# Patient Record
Sex: Female | Born: 2000 | Race: Black or African American | Hispanic: No | Marital: Single | Smoking: Never smoker
Health system: Southern US, Community
[De-identification: ages and names within clinical notes are randomized; demographics above are authoritative.]

## PROBLEM LIST (undated history)

## (undated) DIAGNOSIS — N926 Irregular menstruation, unspecified: Secondary | ICD-10-CM

## (undated) DIAGNOSIS — E119 Type 2 diabetes mellitus without complications: Secondary | ICD-10-CM

## (undated) DIAGNOSIS — T7840XA Allergy, unspecified, initial encounter: Secondary | ICD-10-CM

## (undated) DIAGNOSIS — I10 Essential (primary) hypertension: Secondary | ICD-10-CM

## (undated) DIAGNOSIS — J45909 Unspecified asthma, uncomplicated: Secondary | ICD-10-CM

## (undated) DIAGNOSIS — E669 Obesity, unspecified: Secondary | ICD-10-CM

## (undated) DIAGNOSIS — K8020 Calculus of gallbladder without cholecystitis without obstruction: Secondary | ICD-10-CM

## (undated) HISTORY — PX: TONSILLECTOMY: SUR1361

## (undated) HISTORY — DX: Type 2 diabetes mellitus without complications: E11.9

## (undated) HISTORY — DX: Irregular menstruation, unspecified: N92.6

## (undated) HISTORY — DX: Unspecified asthma, uncomplicated: J45.909

## (undated) HISTORY — DX: Obesity, unspecified: E66.9

## (undated) HISTORY — DX: Calculus of gallbladder without cholecystitis without obstruction: K80.20

## (undated) HISTORY — DX: Allergy, unspecified, initial encounter: T78.40XA

## (undated) HISTORY — DX: Essential (primary) hypertension: I10

## (undated) HISTORY — PX: WISDOM TOOTH EXTRACTION: SHX21

---
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
## 2021-01-01 ENCOUNTER — Other Ambulatory Visit: Payer: Self-pay | Admitting: Family

## 2021-01-10 ENCOUNTER — Other Ambulatory Visit: Payer: Self-pay | Admitting: Family

## 2021-01-28 ENCOUNTER — Other Ambulatory Visit: Payer: Self-pay

## 2021-01-28 ENCOUNTER — Ambulatory Visit (INDEPENDENT_AMBULATORY_CARE_PROVIDER_SITE_OTHER): Payer: 59 | Admitting: Pediatrics

## 2021-01-28 VITALS — Wt 222.2 lb

## 2021-01-28 DIAGNOSIS — R109 Unspecified abdominal pain: Secondary | ICD-10-CM | POA: Diagnosis not present

## 2021-01-28 DIAGNOSIS — K8000 Calculus of gallbladder with acute cholecystitis without obstruction: Secondary | ICD-10-CM | POA: Diagnosis not present

## 2021-01-28 DIAGNOSIS — K760 Fatty (change of) liver, not elsewhere classified: Secondary | ICD-10-CM | POA: Diagnosis not present

## 2021-01-28 DIAGNOSIS — Z8379 Family history of other diseases of the digestive system: Secondary | ICD-10-CM

## 2021-01-28 LAB — POCT URINALYSIS DIPSTICK
Bilirubin, UA: NEGATIVE
Blood, UA: POSITIVE
Glucose, UA: NEGATIVE
Ketones, UA: NEGATIVE
Leukocytes, UA: NEGATIVE
Nitrite, UA: NEGATIVE
Protein, UA: NEGATIVE
Spec Grav, UA: 1.01 (ref 1.010–1.025)
Urobilinogen, UA: 0.2 E.U./dL
pH, UA: 7.5 (ref 5.0–8.0)

## 2021-01-28 LAB — POCT URINE PREGNANCY: Preg Test, Ur: NEGATIVE

## 2021-01-28 NOTE — Progress Notes (Signed)
CC: abdominal pain   HPI: Krista Wang is here today because of intermittent abdominal pain on the right side. There is strong family history maternally including her mom, grandmother and aunts who have had their gallbladders removed. No vomiting, no pain in her shoulder, no back pain, no fever. Her last period was a month ago and happens regularly.   PE No distress, cooperative  Heart sound intensity, no murmur, RRR Lungs clear  Abdomen obese, non tender, negative murphy sign, non distended  No focal findings  Negative pregnancy test  U/A negative    20 yo with left lower abdominal pain concern for gallbladder given family history  Labs today  Ultrasound ordered  If positive then will refer her to a Gi specialist  Decrease fatty foods and increase fluids  Follow up as needed or if symptoms worsen.

## 2021-01-29 LAB — URINE CULTURE
MICRO NUMBER:: 11644085
Result:: NO GROWTH
SPECIMEN QUALITY:: ADEQUATE

## 2021-01-31 ENCOUNTER — Ambulatory Visit (HOSPITAL_COMMUNITY): Payer: 59

## 2021-02-04 ENCOUNTER — Ambulatory Visit (HOSPITAL_COMMUNITY)
Admission: RE | Admit: 2021-02-04 | Discharge: 2021-02-04 | Disposition: A | Discharge status: Home / Self Care | Payer: 59 | Source: Ambulatory Visit | Attending: Pediatrics | Admitting: Pediatrics

## 2021-02-04 DIAGNOSIS — Z8379 Family history of other diseases of the digestive system: Secondary | ICD-10-CM | POA: Diagnosis present

## 2021-02-04 DIAGNOSIS — R109 Unspecified abdominal pain: Secondary | ICD-10-CM | POA: Insufficient documentation

## 2021-02-07 ENCOUNTER — Encounter: Payer: Self-pay | Admitting: Pediatrics

## 2021-02-07 ENCOUNTER — Telehealth: Payer: Self-pay | Admitting: *Deleted

## 2021-02-07 DIAGNOSIS — K760 Fatty (change of) liver, not elsewhere classified: Secondary | ICD-10-CM | POA: Insufficient documentation

## 2021-02-07 DIAGNOSIS — K8020 Calculus of gallbladder without cholecystitis without obstruction: Secondary | ICD-10-CM | POA: Insufficient documentation

## 2021-02-07 NOTE — Addendum Note (Signed)
Addended by: Shirlean Kelly T on: 02/07/2021 06:50 PM   Modules accepted: Orders

## 2021-02-07 NOTE — Telephone Encounter (Signed)
[place] mom called for results on abdominal xray and Samhita has gall stones. Dr. Laural Benes needs a referral for a GI doctor. Can you do this? Thank you. Almira Coaster

## 2021-02-08 ENCOUNTER — Telehealth: Payer: Self-pay | Admitting: *Deleted

## 2021-02-08 NOTE — Telephone Encounter (Signed)
Referral sent over this morning

## 2021-02-08 NOTE — Telephone Encounter (Signed)
Referral sent over this a.m. will call to follow up about appt

## 2021-02-13 ENCOUNTER — Encounter: Payer: Self-pay | Admitting: Internal Medicine

## 2021-03-07 ENCOUNTER — Encounter (INDEPENDENT_AMBULATORY_CARE_PROVIDER_SITE_OTHER): Payer: Self-pay | Admitting: Family

## 2021-03-07 ENCOUNTER — Ambulatory Visit (INDEPENDENT_AMBULATORY_CARE_PROVIDER_SITE_OTHER): Payer: 59 | Admitting: Family

## 2021-03-07 ENCOUNTER — Other Ambulatory Visit: Payer: Self-pay

## 2021-03-07 VITALS — BP 118/72 | HR 88 | Wt 217.0 lb

## 2021-03-07 DIAGNOSIS — E669 Obesity, unspecified: Secondary | ICD-10-CM

## 2021-03-07 DIAGNOSIS — I10 Essential (primary) hypertension: Secondary | ICD-10-CM

## 2021-03-07 DIAGNOSIS — R739 Hyperglycemia, unspecified: Secondary | ICD-10-CM

## 2021-03-07 DIAGNOSIS — L83 Acanthosis nigricans: Secondary | ICD-10-CM | POA: Diagnosis not present

## 2021-03-07 DIAGNOSIS — Z68.41 Body mass index (BMI) pediatric, greater than or equal to 95th percentile for age: Secondary | ICD-10-CM

## 2021-03-07 DIAGNOSIS — Z794 Long term (current) use of insulin: Secondary | ICD-10-CM | POA: Diagnosis not present

## 2021-03-07 DIAGNOSIS — E1065 Type 1 diabetes mellitus with hyperglycemia: Secondary | ICD-10-CM | POA: Diagnosis not present

## 2021-03-07 LAB — POCT GLYCOSYLATED HEMOGLOBIN (HGB A1C): Hemoglobin A1C: 6.6 % — AB (ref 4.0–5.6)

## 2021-03-07 LAB — POCT GLUCOSE (DEVICE FOR HOME USE): Glucose Fasting, POC: 164 mg/dL — AB (ref 70–99)

## 2021-03-07 NOTE — Progress Notes (Signed)
Pediatric Endocrinology Diabetes Consultation Follow-up Visit  Krista Wang Aug 31, 2001 [id number]  Chief Complaint: Follow-up type 1 diabetes   Krista Oz, MD   HPI: Krista Wang  is a 20 y.o. female presenting for follow-up of type 1 diabetes. she is accompanied to this visit by her mother and father.  1. Krista Wang about two years ago that her urine was thick like juice. She saw her PCP and had elevated urine glucose. She went to [facility] where her HbA1c was 11%. She was diagnosed with T2DM, started on metformin, 500 mg, twice daily, and followed at [facility] until about 9 months ago. During that time her weight had continued to increase to apeak weight of 223 pounds in march 2017. Her HbA1c had decreased to about 6% as of her last visit. Her weight has decreased from 217 pounds in May 2017 to 189 now. She has been trying to lose weight.  About two weeks ago Krista Wang became aware that she was urinating more, had nocturia, and was drinking more. She also developed nausea, upset stomach severe enough to cause her not to want to eat, and abdominal pains. Her vision has been normal. She has lost 31 pounds since May. She presented to clinic with a A1c of >14 and urine ketones. She was admitted to the [facility] and started on two component method using Lantus and Novolog.   She had elevated insulin antibody with normal GAD and negative pancreatic islet cell antibody.   2. Since discharge from hospital on 11/2020, Krista Wang has been doing well. No Hospitalizations.   She is doing well at [employer], major in psychology. She is living in dorm but it is going well. She is working at [employer]. She gets exercise by walking for class and is walking 1 hour per day with her friends.   Wearing Dexcom CGM, it is working well. Occasionally has false lows at night if she sleeps on dexcom. She is giving injection before eating. Estimates her  carb counts but feels like she is pretty accurate. Hypoglycemia is rare, she is not feeling lows consistently.  Concerns:  - Running high around 3pm.   Followed by nephrology for hypertension. She has not returned for follow up appointment.   Insulin regimen: 32 units of Lantus. Novolog 120/30/6 plan  Hypoglycemia: Able to feel low blood sugars.  No glucagon needed recently.  Blood glucose download:  Dexcom CGM:      Med-alert ID: Not currently wearing. Injection sites: Arms, legs and abdomen  Annual labs due: 05/2021 Ophthalmology due: 2021. Discussed with family today.     3. ROS: Greater than 10 systems reviewed with pertinent positives listed in HPI, otherwise neg. Review of Systems  Constitutional: Negative for malaise/fatigue.  HENT: Negative.   Eyes: Negative for blurred vision and pain.  Respiratory: Negative for cough and shortness of breath.   Cardiovascular: Negative for chest pain and palpitations.  Gastrointestinal: Negative for abdominal pain, constipation, diarrhea and heartburn.  Genitourinary: Negative for frequency and urgency.  Musculoskeletal: Negative for neck pain.  Skin: Negative for itching and rash.  Neurological: Negative for dizziness, tremors, sensory change and weakness.  Endo/Heme/Allergies: Negative for polydipsia.  Psychiatric/Behavioral: Negative for depression. The patient is not nervous/anxious.      Past Medical History:   Past Medical History:  Diagnosis Date  . Allergy   . Asthma   . Diabetes mellitus without complication (HCC)   . Gallstones   . Hypertension    [facility], did not follow up or have an appt in July 2019 per Nephrology's recs  . Impacted ear wax 12/21/2015  . Irregular menses   . Nocturnal enuresis 07/01/2013  . Obesity   . Unspecified constipation 07/07/2013  . Weight gain 07/07/2013    Medications:  Outpatient Encounter Medications as of 03/07/2021  Medication Sig  . Continuous Blood Gluc  Sensor (DEXCOM G6 SENSOR) MISC USE AS DIRECTED CHANGE  SENSOR  EVERY  10  DAYS  . Continuous Blood Gluc Transmit (DEXCOM G6 TRANSMITTER) MISC USE AS DIRECTED EVERY 3 MONTHS  . insulin glargine (LANTUS SOLOSTAR) 100 UNIT/ML Solostar Pen INJECT UP TO 50 UNITS SUBCUTANEOUSLY ONCE DAILY  . norgestimate-ethinyl estradiol (SPRINTEC 28) 0.25-35 MG-MCG tablet Take 1 tablet by mouth daily. Patient overdue for yearly check up, needs to schedule  . NOVOLOG FLEXPEN 100 UNIT/ML FlexPen INJECT UP TO 50 UNITS SUBCUTANEOUSLY ONCE DAILY AS DIRECTED  . Accu-Chek FastClix Lancets MISC USE 1 LANCET TO CHECK GLUCOSE SIX TIMES DAILY (Patient not taking: No sig reported)  . ACCU-CHEK GUIDE test strip USE TO CHECK GLUCOSE 6 TIMES DAILY (Patient not taking: No sig reported)  . acetone, urine, test strip Check ketones per protocol (Patient not taking: No sig reported)  . albuterol (PROVENTIL HFA;VENTOLIN HFA) 108 (90 Base) MCG/ACT inhaler 2 puffs before exercise or every 4 to 6 hours as needed for cough or wheezing (Patient not taking: No sig reported)  . BAQSIMI ONE PACK 3 MG/DOSE POWD PLACE 1 DOSE INTO THE NOSE AS NEEDED (Patient not taking: No sig reported)  . BD PEN NEEDLE NANO 2ND GEN 32G X 4 MM MISC USE 1 PEN NEEDLE SIX TIMES DAILY TO INJECT INSULIN (Patient not taking: No sig reported)  . glucagon 1 MG injection Use for Severe Hypoglycemia . Inject 1 mg intramuscularly if unresponsive, unable to swallow, unconscious and/or has seizure (Patient not taking: No sig reported)   No facility-administered encounter medications on file as of 03/07/2021.    Allergies: Allergies  Allergen Reactions  . Blue Dyes (Parenteral) Swelling    Surgical History: Past Surgical History:  Procedure Laterality Date  . TONSILLECTOMY    . WISDOM TOOTH EXTRACTION      Family History:  Family History  Problem Relation Age of Onset  . Diabetes Mother   . Hypertension Mother   . Diabetes Father   . Hypertension Father   .  Hypertension Maternal Grandmother   . Stroke Maternal Grandmother   . Diabetes Maternal Grandmother   . Thyroid disease Maternal Grandmother   . Heart attack Maternal Grandfather   . Diabetes Paternal Grandmother   . Hypertension Paternal Grandmother   . Dementia Paternal Grandmother   . Kidney failure Paternal Grandfather   . Hypertension Paternal Grandfather   . Diabetes Paternal Grandfather   . Thyroid disease Maternal Aunt       Social History: Lives with: mother and father  [employer]   Physical Exam:  Vitals:   03/07/21 0947  BP: 118/72  Pulse: 88  Weight: 217 lb (98.4 kg)   BP 118/72 (BP Location: Left Arm, Patient Position: Sitting, Cuff Size: Normal)   Pulse 88   Wt 217 lb (98.4 kg)   BMI 34.87 kg/m  Body mass index: body mass index is 34.87 kg/m. Blood pressure percentiles are not available for patients who are 18 years or older.  Ht Readings from Last 3 Encounters:  05/23/20 5' 6.14" (1.68 m) (77 %, Z= 0.74)*  04/10/20 5' 4.57" (1.64 m) (55 %, Z= 0.13)*  10/05/19 5' 5.35" (1.66 m) (67 %, Z= 0.45)*   * Growth percentiles are based on CDC (Girls, 2-20 Years) data.   Wt Readings from Last 3 Encounters:  03/07/21 217 lb (98.4 kg) (99 %, Z= 2.17)*  01/28/21 222 lb 3.2 Wang (100.8 kg) (99 %, Z= 2.23)*  08/28/20 215 lb 6.4 Wang (97.7 kg) (98 %, Z= 2.15)*   * Growth percentiles are based on CDC (Girls, 2-20 Years) data.    Physical Exam  General: Well developed, well nourished female in no acute distress.   Head: Normocephalic, atraumatic.   Eyes:  Pupils equal and round. EOMI.   Sclera white.  No eye drainage.   Ears/Nose/Mouth/Throat: Nares patent, no nasal drainage.  Normal dentition, mucous membranes moist.   Neck: supple, no cervical lymphadenopathy, no thyromegaly Cardiovascular: regular rate, normal S1/S2, no murmurs Respiratory: No increased work of breathing.  Lungs clear to auscultation bilaterally.  No wheezes. Abdomen: soft, nontender,  nondistended. Normal bowel sounds.  No appreciable masses  Extremities: warm, well perfused, cap refill < 2 sec.   Musculoskeletal: Normal muscle mass.  Normal strength Skin: warm, dry.  No rash or lesions. + acanthosis nigricans to posterior neck.  Neurologic: alert and oriented, normal speech, no tremor   Labs:  Last Hemoglobin A1c: 6.8% on 08/2020  Results for orders placed or performed in visit on 03/07/21  POCT glycosylated hemoglobin (Hb A1C)  Result Value Ref Range   Hemoglobin A1C 6.6 (A) 4.0 - 5.6 %   HbA1c POC (<> result, manual entry)     HbA1c, POC (prediabetic range)     HbA1c, POC (controlled diabetic range)    POCT Glucose (Device for Home Use)  Result Value Ref Range   Glucose Fasting, POC 164 (A) 70 - 99 mg/dL   POC Glucose      Assessment/Plan: Krista Wang is a 20 y.o. female with type 1 diabetes on MDI and Dexcom CGM. Doing well with diabetes management. Has a pattern of mild hyperglycemia overnight, will increase Lantus. Hemoglobin A1c of 6.6% meets the ADA goal of <7%.    1. DM w/o complication type I, uncontrolled (HCC)/hypergylcemia/elevated a1c  - Increase to 34 units of Lantus   -  Novolog 120/30/6 plan.   - Reviewed meter and CGM download. Discussed trends and patterns.  - Rotate injection sites to prevent scar tissue.  - bolus 15 minutes prior to eating to limit blood sugar spikes.  - Reviewed carb counting and importance of accurate carb counting.  - Discussed signs and symptoms of hypoglycemia. Always have glucose available.  - POCT glucose and hemoglobin A1c  - Reviewed growth chart.   - Discussed insulin pump technology. Declines at this time.   2. Acanthosis nigricans - Stable and consistent with insulin resistance.   3. Obesity - Reviewed growth chart  - Encouraged daily activity  - Discussed diet.   4. Hypertension.  - Continue follow up with Nephrology  - Discussed importance of good diabetes care.     Follow-up:   3 month. Mychart  message with blood sugars as needed for adjustments.      >50 spent today reviewing the medical chart, counseling the patient/family, and documenting today's visit.   When a patient is on insulin, intensive monitoring of blood glucose levels is necessary to avoid hyperglycemia and hypoglycemia. Severe hyperglycemia/hypoglycemia can lead to hospital admissions and be life threatening.    Gretchen Short,  FNP-C  Pediatric  Specialist  [address]  [place], [address]  Tele: [phone]

## 2021-03-07 NOTE — Patient Instructions (Signed)

## 2021-03-21 ENCOUNTER — Ambulatory Visit: Payer: 59 | Admitting: Internal Medicine

## 2021-03-21 ENCOUNTER — Encounter: Payer: Self-pay | Admitting: Internal Medicine

## 2021-03-21 ENCOUNTER — Other Ambulatory Visit: Payer: Self-pay | Admitting: *Deleted

## 2021-03-21 ENCOUNTER — Other Ambulatory Visit: Payer: Self-pay

## 2021-03-21 VITALS — BP 130/85 | HR 92 | Temp 97.3°F | Ht 66.0 in | Wt 222.0 lb

## 2021-03-21 DIAGNOSIS — K8000 Calculus of gallbladder with acute cholecystitis without obstruction: Secondary | ICD-10-CM | POA: Diagnosis not present

## 2021-03-21 DIAGNOSIS — K5900 Constipation, unspecified: Secondary | ICD-10-CM

## 2021-03-21 DIAGNOSIS — K760 Fatty (change of) liver, not elsewhere classified: Secondary | ICD-10-CM

## 2021-03-21 NOTE — Patient Instructions (Addendum)
We will refer you to Dr. Henreitta Leber to discuss cholecystectomy.  For your fatty liver, it is very important to keep your diabetes under good control.  Can also work on weight loss and living a healthy lifestyle.  Avoid alcohol in excess.  I will check some baseline liver labs today in clinic.  We will call you with these results.  For your constipation I want you to start taking MiraLAX 1 capful daily.  If this is not adequate you can go up to 2 capfuls daily.  At [facility] we value your feedback. You may receive a survey about your visit today. Please share your experience as we strive to create trusting relationships with our patients to provide genuine, compassionate, quality care.  We appreciate your understanding and patience as we review any laboratory studies, imaging, and other diagnostic tests that are ordered as we care for you. Our office policy is 5 business days for review of these results, and any emergent or urgent results are addressed in a timely manner for your best interest. If you do not hear from our office in 1 week, please contact us.   We also encourage the use of MyChart, which contains your medical information for your review as well. If you are not enrolled in this feature, an access code is on this after visit summary for your convenience. Thank you for allowing [place] to be involved in your care.  It was great to see you today!  I hope you have a great rest of your spring!!    Hennie Duos. Marletta Lor, D.O. Gastroenterology and Hepatology [facility]

## 2021-03-21 NOTE — Progress Notes (Signed)
Primary Care Physician:  Fransisca Connors, MD Primary Gastroenterologist:  Dr. Abbey Chatters  Chief Complaint  Patient presents with  . fatty liver  . Abdominal Pain    Right upper abd  . Nausea    No vomiting  . Constipation    HPI:   Krista Wang is a 20 y.o. female who presents to the clinic today by referral from her PCP Dr. Raul Del for evaluation.  She has a history of type 1 diabetes as well as obesity.  She states approximately 8 months ago she began having intermittent right upper quadrant pain.  She would have these "attacks" every so often but seems as though they have been more frequent as of late.  Her most recent episode she had sudden onset right upper quadrant pain that radiated to her shoulder.  Pain was so severe she could not sleep.  She underwent ultrasound which showed cholelithiasis and fatty liver disease.  Also has some associated nausea with these attacks.  No diarrhea.  Does note constipation which she has been dealing with her whole life.  She has taken fiber Gummies which made her go "too much."  No melena hematochezia.  No lower abdominal pain.  Does note some bloating when she gets "backed up."  Past Medical History:  Diagnosis Date  . Allergy   . Asthma   . Diabetes mellitus without complication ([place])   . Gallstones   . Hypertension    Evaluated by [facility] in 2019, did not follow up or have an appt in July 2019 per Nephrology's recs  . Impacted ear wax 12/21/2015  . Irregular menses   . Nocturnal enuresis 07/01/2013  . Obesity   . Unspecified constipation 07/07/2013  . Weight gain 07/07/2013    Past Surgical History:  Procedure Laterality Date  . TONSILLECTOMY    . WISDOM TOOTH EXTRACTION      Current Outpatient Medications  Medication Sig Dispense Refill  . Accu-Chek FastClix Lancets MISC USE 1 LANCET TO CHECK GLUCOSE SIX TIMES DAILY 204 each 5  . ACCU-CHEK GUIDE test strip USE TO CHECK GLUCOSE 6 TIMES DAILY 200 each 5  . albuterol  (PROVENTIL HFA;VENTOLIN HFA) 108 (90 Base) MCG/ACT inhaler 2 puffs before exercise or every 4 to 6 hours as needed for cough or wheezing 2 Inhaler 1  . BD PEN NEEDLE NANO 2ND GEN 32G X 4 MM MISC USE 1 PEN NEEDLE SIX TIMES DAILY TO INJECT INSULIN 200 each 5  . Continuous Blood Gluc Sensor (DEXCOM G6 SENSOR) MISC USE AS DIRECTED CHANGE  SENSOR  EVERY  10  DAYS 3 each 5  . Continuous Blood Gluc Transmit (DEXCOM G6 TRANSMITTER) MISC USE AS DIRECTED EVERY 3 MONTHS 2 each 5  . glucagon 1 MG injection Use for Severe Hypoglycemia . Inject 1 mg intramuscularly if unresponsive, unable to swallow, unconscious and/or has seizure 2 kit 3  . insulin glargine (LANTUS SOLOSTAR) 100 UNIT/ML Solostar Pen INJECT UP TO 50 UNITS SUBCUTANEOUSLY ONCE DAILY 15 mL 5  . norgestimate-ethinyl estradiol (SPRINTEC 28) 0.25-35 MG-MCG tablet Take 1 tablet by mouth daily. Patient overdue for yearly check up, needs to schedule 30 tablet 11  . NOVOLOG FLEXPEN 100 UNIT/ML FlexPen INJECT UP TO 50 UNITS SUBCUTANEOUSLY ONCE DAILY AS DIRECTED 15 mL 5  . acetone, urine, test strip Check ketones per protocol (Patient not taking: No sig reported) 50 each 3  . BAQSIMI ONE PACK 3 MG/DOSE POWD PLACE 1 DOSE INTO THE NOSE AS NEEDED (Patient  not taking: No sig reported) 2 each 5   No current facility-administered medications for this visit.    Allergies as of 03/21/2021 - Review Complete 03/21/2021  Allergen Reaction Noted  . Blue dyes (parenteral) Swelling 08/18/2017    Family History  Problem Relation Age of Onset  . Diabetes Mother   . Hypertension Mother   . Diabetes Father   . Hypertension Father   . Hypertension Maternal Grandmother   . Stroke Maternal Grandmother   . Diabetes Maternal Grandmother   . Thyroid disease Maternal Grandmother   . Heart attack Maternal Grandfather   . Diabetes Paternal Grandmother   . Hypertension Paternal Grandmother   . Dementia Paternal Grandmother   . Kidney failure Paternal Grandfather   .  Hypertension Paternal Grandfather   . Diabetes Paternal Grandfather   . Thyroid disease Maternal Aunt     Social History   Socioeconomic History  . Marital status: Single    Spouse name: Not on file  . Number of children: Not on file  . Years of education: Not on file  . Highest education level: Not on file  Occupational History  . Not on file  Tobacco Use  . Smoking status: Never Smoker  . Smokeless tobacco: Never Used  Substance and Sexual Activity  . Alcohol use: No  . Drug use: No  . Sexual activity: Never  Other Topics Concern  . Not on file  Social History Narrative   Lives with parents and sibling         Will attend [employer] in fall 2021, thinking about being a Marine scientist    Social Determinants of Radio broadcast assistant Strain: Not on file  Food Insecurity: Not on file  Transportation Needs: Not on file  Physical Activity: Not on file  Stress: Not on file  Social Connections: Not on file  Intimate Partner Violence: Not on file    Subjective: Review of Systems  Constitutional: Negative for chills and fever.  HENT: Negative for congestion and hearing loss.   Eyes: Negative for blurred vision and double vision.  Respiratory: Negative for cough and shortness of breath.   Cardiovascular: Negative for chest pain and palpitations.  Gastrointestinal: Positive for abdominal pain and nausea. Negative for blood in stool, constipation, diarrhea, heartburn, melena and vomiting.  Genitourinary: Negative for dysuria and urgency.  Musculoskeletal: Negative for joint pain and myalgias.  Skin: Negative for itching and rash.  Neurological: Negative for dizziness and headaches.  Psychiatric/Behavioral: Negative for depression. The patient is not nervous/anxious.        Objective: BP 130/85   Pulse 92   Temp (!) 97.3 F (36.3 C) (Temporal)   Ht _0  (1.676 m)   Wt 222 lb (100.7 kg)   LMP 02/21/2021 (Approximate)   BMI 35.83 kg/m  Physical Exam Constitutional:       Appearance: Normal appearance.  HENT:     Head: Normocephalic and atraumatic.  Eyes:     Extraocular Movements: Extraocular movements intact.     Conjunctiva/sclera: Conjunctivae normal.  Cardiovascular:     Rate and Rhythm: Normal rate and regular rhythm.  Pulmonary:     Effort: Pulmonary effort is normal.     Breath sounds: Normal breath sounds.  Abdominal:     General: Bowel sounds are normal.     Palpations: Abdomen is soft.  Musculoskeletal:        General: No swelling. Normal range of motion.     Cervical back: Normal range of  motion and neck supple.  Skin:    General: Skin is warm and dry.     Coloration: Skin is not jaundiced.  Neurological:     General: No focal deficit present.     Mental Status: She is alert and oriented to person, place, and time.  Psychiatric:        Mood and Affect: Mood normal.        Behavior: Behavior normal.      Assessment: *Right upper quadrant abdominal pain *Cholelithiasis *Constipation *Fatty liver disease  Plan: Patient's right upper quadrant pain likely due to cholelithiasis seen on ultrasound.  We will refer to Dr. Constance Haw to further discuss potential cholecystectomy.  For her constipation I recommended that she add daily MiraLAX 1 capful.  If this is not adequate she can go to 1 capful twice daily.  Discussed fatty liver disease in depth with her today.  Risk factors for this particular patient include type 1 diabetes as well as being overweight.  Counseled on the vast importance of keeping her diabetes under control.  Most recent A1c 6.6 also counseled on living a healthy lifestyle diet and exercise wise as well as avoiding alcohol in excess.  Will order baseline liver labs today.  Thank you Dr. Raul Del for the kind referral.  03/21/2021 9:36 AM   Disclaimer: This note was dictated with voice recognition software. Similar sounding words can inadvertently be transcribed and may not be corrected upon review.

## 2021-03-26 ENCOUNTER — Ambulatory Visit (INDEPENDENT_AMBULATORY_CARE_PROVIDER_SITE_OTHER): Payer: 59 | Admitting: General Surgery

## 2021-03-26 ENCOUNTER — Other Ambulatory Visit: Payer: Self-pay

## 2021-03-26 ENCOUNTER — Encounter: Payer: Self-pay | Admitting: General Surgery

## 2021-03-26 VITALS — BP 109/75 | HR 87 | Temp 98.2°F | Resp 14 | Ht 66.0 in | Wt 224.0 lb

## 2021-03-26 DIAGNOSIS — K8020 Calculus of gallbladder without cholecystitis without obstruction: Secondary | ICD-10-CM | POA: Diagnosis not present

## 2021-03-26 NOTE — Patient Instructions (Signed)
Cholelithiasis  Cholelithiasis happens when gallstones form in the gallbladder. The gallbladder stores bile. Bile is a fluid that helps digest fats. Bile can harden and form into gallstones. If they cause a blockage, they can cause pain (gallbladder attack). What are the causes? This condition may be caused by:  Some blood diseases, such as sickle cell anemia.  Too much of a fat-like substance (cholesterol) in your bile.  Not enough bile salts in your bile. These salts help the body absorb and digest fats.  The gallbladder not emptying fully or often enough. This is common in pregnant women. What increases the risk? The following factors may make you more likely to develop this condition:  Being female.  Being pregnant many times.  Eating a lot of fried foods, fat, and refined carbs (refined carbohydrates).  Being very overweight (obese).  Being older than age 40.  Using medicines with female hormones in them for a long time.  Losing weight fast.  Having gallstones in your family.  Having some health problems, such as diabetes, Crohn's disease, or liver disease. What are the signs or symptoms? Often, there may be gallstones but no symptoms. These gallstones are called silent gallstones. If a gallstone causes a blockage, you may get sudden pain. The pain:  Can be in the upper right part of your belly (abdomen).  Normally comes at night or after you eat.  Can last an hour or more.  Can spread to your right shoulder, back, or chest.  Can feel like discomfort, burning, or fullness in the upper part of your belly (indigestion). If the blockage lasts more than a few hours, you can get an infection or swelling. You may:  Feel like you may vomit.  Vomit.  Feel bloated.  Have belly pain for 5 hours or more.  Feel tender in your belly, often in the upper right part and under your ribs.  Have fever or chills.  Have skin or the white parts of your eyes turn yellow  (jaundice).  Have dark pee (urine) or pale poop (stool). How is this treated? Treatment for this condition depends on how bad you feel. If you have symptoms, you may need:  Home care, if symptoms are not very bad. ? Do not eat for 12-24 hours. Drink only water and clear liquids. ? Start to eat simple or clear foods after 1 or 2 days. Try broths and crackers. ? You may need medicines for pain or stomach upset or both. ? If you have an infection, you will need antibiotics.  A hospital stay, if you have very bad pain or a very bad infection.  Surgery to remove your gallbladder. You may need this if: ? Gallstones keep coming back. ? You have very bad symptoms.  Medicines to break up gallstones. Medicines: ? Are best for small gallstones. ? May be used for up to 6-12 months.  A procedure to find and take out gallstones or to break up gallstones. Follow these instructions at home: Medicines  Take over-the-counter and prescription medicines only as told by your doctor.  If you were prescribed an antibiotic medicine, take it as told by your doctor. Do not stop taking the antibiotic even if you start to feel better.  Ask your doctor if the medicine prescribed to you requires you to avoid driving or using machinery. Eating and drinking  Drink enough fluid to keep your urine pale yellow. Drink water or clear fluids. This is important when you have pain.  Eat   healthy foods. Choose: ? Fewer fatty foods, such as fried foods. ? Fewer refined carbs. Avoid breads and grains that are highly processed, such as white bread and white rice. Choose whole grains, such as whole-wheat bread and brown rice. ? More fiber. Almonds, fresh fruit, and beans are healthy sources. General instructions  Keep a healthy weight.  Keep all follow-up visits as told by your doctor. This is important. Where to find more information  National Institute of Diabetes and Digestive and Kidney Diseases:  www.niddk.nih.gov Contact a doctor if:  You have sudden pain in the upper right part of your belly. Pain might spread to your right shoulder, back, or chest.  You have been diagnosed with gallstones that have no symptoms and you get: ? Belly pain. ? Discomfort, burning, or fullness in the upper part of your abdomen.  You have dark urine or pale stools. Get help right away if:  You have sudden pain in the upper right part of your abdomen, and the pain lasts more than 2 hours.  You have pain in your abdomen, and: ? It lasts more than 5 hours. ? It keeps getting worse.  You have a fever or chills.  You keep feeling like you may vomit.  You keep vomiting.  Your skin or the white parts of your eyes turn yellow. Summary  Cholelithiasis happens when gallstones form in the gallbladder.  This condition may be caused by a blood disease, too much of a fat-like substance in the bile, or not enough bile salts in bile.  Treatment for this condition depends on how bad you feel.  If you have symptoms, do not eat or drink. You may need medicines. You may need a hospital stay for very bad pain or a very bad infection.  You may need surgery if gallstones keep coming back or if you have very bad symptoms. This information is not intended to replace advice given to you by your health care provider. Make sure you discuss any questions you have with your health care provider. Document Revised: 12/23/2019 Document Reviewed: 09/26/2019 Elsevier Patient Education  2021 Elsevier Inc.   Minimally Invasive Cholecystectomy Minimally invasive cholecystectomy is surgery to remove the gallbladder. The gallbladder is a pear-shaped organ that lies beneath the liver on the right side of the body. The gallbladder stores bile, which is a fluid that helps the body digest fats. Cholecystectomy is often done to treat inflammation of the gallbladder (cholecystitis). This condition is usually caused by a buildup of  gallstones (cholelithiasis) in the gallbladder. Gallstones can block the flow of bile, which can result in inflammation and pain. In severe cases, emergency surgery may be required. This procedure is done though small incisions in the abdomen, instead of one large incision. It is also called laparoscopic surgery. A thin scope with a camera (laparoscope) is inserted through one incision. Then surgical instruments are inserted through the other incisions. In some cases, a minimally invasive surgery may need to be changed to a surgery that is done through a larger incision. This is called open surgery. Tell a health care provider about:  Any allergies you have.  All medicines you are taking, including vitamins, herbs, eye drops, creams, and over-the-counter medicines.  Any problems you or family members have had with anesthetic medicines.  Any blood disorders you have.  Any surgeries you have had.  Any medical conditions you have.  Whether you are pregnant or may be pregnant. What are the risks? Generally, this is a   safe procedure. However, problems may occur, including:  Infection.  Bleeding.  Allergic reactions to medicines.  Damage to nearby structures or organs.  A stone remaining in the common bile duct. The common bile duct carries bile from the gallbladder into the small intestine.  A bile leak from the cyst duct that is clipped when your gallbladder is removed. Medicines Ask your health care provider about:  Changing or stopping your regular medicines. This is especially important if you are taking diabetes medicines or blood thinners.  Taking medicines such as aspirin and ibuprofen. These medicines can thin your blood. Do not take these medicines unless your health care provider tells you to take them.  Taking over-the-counter medicines, vitamins, herbs, and supplements. General instructions  Let your health care provider know if you develop a cold or an infection  before surgery.  Plan to have someone take you home from the hospital or clinic.  If you will be going home right after the procedure, plan to have someone with you for 24 hours.  Ask your health care provider: ? How your surgery site will be marked. ? What steps will be taken to help prevent infection. These may include:  Removing hair at the surgery site.  Washing skin with a germ-killing soap.  Taking antibiotic medicine. What happens during the procedure?  An IV will be inserted into one of your veins.  You will be given one or both of the following: ? A medicine to help you relax (sedative). ? A medicine to make you fall asleep (general anesthetic).  A breathing tube will be placed in your mouth.  Your surgeon will make several small incisions in your abdomen.  The laparoscope will be inserted through one of the small incisions. The camera on the laparoscope will send images to a monitor in the operating room. This lets your surgeon see inside your abdomen.  A gas will be pumped into your abdomen. This will expand your abdomen to give the surgeon more room to perform the surgery.  Other tools that are needed for the procedure will be inserted through the other incisions. The gallbladder will be removed through one of the incisions.  Your common bile duct may be examined. If stones are found in the common bile duct, they may be removed.  After your gallbladder has been removed, the incisions will be closed with stitches (sutures), staples, or skin glue.  Your incisions may be covered with a bandage (dressing). The procedure may vary among health care providers and hospitals.   What happens after the procedure?  Your blood pressure, heart rate, breathing rate, and blood oxygen level will be monitored until you leave the hospital or clinic.  You will be given medicines as needed to control your pain.  If you were given a sedative during the procedure, it can affect you  for several hours. Do not drive or operate machinery until your health care provider says that it is safe. Summary  Minimally invasive cholecystectomy, also called laparoscopic cholecystectomy, is surgery to remove the gallbladder using small incisions.  Tell your health care provider about all the medical conditions you have and all the medicines you are taking for those conditions.  Before the procedure, follow instructions about eating or drinking restrictions and changing or stopping medicines.  If you were given a sedative during the procedure, it can affect you for several hours. Do not drive or operate machinery until your health care provider says that it is safe. This   information is not intended to replace advice given to you by your health care provider. Make sure you discuss any questions you have with your health care provider. Document Revised: 08/08/2019 Document Reviewed: 08/08/2019 Elsevier Patient Education  2021 Elsevier Inc.   

## 2021-03-26 NOTE — Progress Notes (Signed)
[facility] History and Physical  Reason for Referral: Gallstones  Referring Physician:  Dr. Marletta Lor   Chief Complaint    New Patient (Initial Visit)      Krista Wang is a 20 y.o. female.  HPI:  Krista Wang is a 20 yo with type I DM who has been having issues with RUQ pain radiating to her shoulder and constipation. She was referred to Dr. Marletta Lor by Dr. Meredeth Ide, Randa Evens, MD her PCP.  She had an [place] that demonstrated gallstones and fatty liver.  She started having pain about 8 months ago and the attacks are more frequent now. She has some associated nausea. The pain has been getting more severe in the RUQ and is stabbing in nature.  She has chronic constipation and was recommended to do some Miralax by Dr. Marletta Lor.  She says that her symptoms all started with eating a steak and have continued whenever she eats beef.   She is a type 1 diabetic and does not have a pump. She does have a Dexcom blood glucose reader. She says her blood sugars are normally about 120 in the AM and if she does not eat would be about 105 by lunch.    Past Medical History:  Diagnosis Date  . Allergy   . Asthma   . Diabetes mellitus without complication (HCC)   . Gallstones   . Hypertension    Evaluated by [facility] in 2019, did not follow up or have an appt in July 2019 per Nephrology's recs  . Impacted ear wax 12/21/2015  . Irregular menses   . Nocturnal enuresis 07/01/2013  . Obesity   . Unspecified constipation 07/07/2013  . Weight gain 07/07/2013    Past Surgical History:  Procedure Laterality Date  . TONSILLECTOMY    . WISDOM TOOTH EXTRACTION      Family History  Problem Relation Age of Onset  . Diabetes Mother   . Hypertension Mother   . Diabetes Father   . Hypertension Father   . Hypertension Maternal Grandmother   . Stroke Maternal Grandmother   . Diabetes Maternal Grandmother   . Thyroid disease Maternal Grandmother   . Heart attack Maternal Grandfather   . Diabetes  Paternal Grandmother   . Hypertension Paternal Grandmother   . Dementia Paternal Grandmother   . Kidney failure Paternal Grandfather   . Hypertension Paternal Grandfather   . Diabetes Paternal Grandfather   . Thyroid disease Maternal Aunt     Social History   Tobacco Use  . Smoking status: Never Smoker  . Smokeless tobacco: Never Used  Substance Use Topics  . Alcohol use: No  . Drug use: No    Medications: I have reviewed the patient's current medications. Allergies as of 03/26/2021      Reactions   Blue Dyes (parenteral) Swelling      Medication List       Accurate as of Mar 26, 2021 11:59 PM. If you have any questions, ask your nurse or doctor.        Accu-Chek FastClix Lancets Misc USE 1 LANCET TO CHECK GLUCOSE SIX TIMES DAILY   Accu-Chek Guide test strip Generic drug: glucose blood USE TO CHECK GLUCOSE 6 TIMES DAILY   acetone (urine) test strip Check ketones per protocol   albuterol 108 (90 Base) MCG/ACT inhaler Commonly known as: VENTOLIN HFA 2 puffs before exercise or every 4 to 6 hours as needed for cough or wheezing What changed:   how much to take  how to take this  when to take this   Baqsimi One Pack 3 MG/DOSE Powd Generic drug: Glucagon PLACE 1 DOSE INTO THE NOSE AS NEEDED   BD Pen Needle Nano 2nd Gen 32G X 4 MM Misc Generic drug: Insulin Pen Needle USE 1 PEN NEEDLE SIX TIMES DAILY TO INJECT INSULIN   Dexcom G6 Sensor Misc USE AS DIRECTED CHANGE  SENSOR  EVERY  10  DAYS   Dexcom G6 Transmitter Misc USE AS DIRECTED EVERY 3 MONTHS   glucagon 1 MG injection Use for Severe Hypoglycemia . Inject 1 mg intramuscularly if unresponsive, unable to swallow, unconscious and/or has seizure What changed:   how much to take  how to take this  when to take this  reasons to take this   Lantus SoloStar 100 UNIT/ML Solostar Pen Generic drug: insulin glargine INJECT UP TO 50 UNITS SUBCUTANEOUSLY ONCE DAILY What changed:   how much to  take  how to take this  when to take this  additional instructions   norgestimate-ethinyl estradiol 0.25-35 MG-MCG tablet Commonly known as: Sprintec 28 Take 1 tablet by mouth daily. Patient overdue for yearly check up, needs to schedule   NovoLOG FlexPen 100 UNIT/ML FlexPen Generic drug: insulin aspart INJECT UP TO 50 UNITS SUBCUTANEOUSLY ONCE DAILY AS DIRECTED What changed: See the new instructions.        ROS:  A comprehensive review of systems was negative except for: Gastrointestinal: positive for abdominal pain, constipation and nausea  Blood pressure 109/75, pulse 87, temperature 98.2 F (36.8 C), temperature source Other (Comment), resp. rate 14, height 5\' 6"  (1.676 m), weight 224 lb (101.6 kg), SpO2 98 %. Physical Exam Vitals reviewed.  Constitutional:      Appearance: Normal appearance. She is obese.  HENT:     Head: Normocephalic.     Nose: Nose normal.  Eyes:     Extraocular Movements: Extraocular movements intact.  Cardiovascular:     Rate and Rhythm: Normal rate and regular rhythm.  Pulmonary:     Effort: Pulmonary effort is normal.     Breath sounds: Normal breath sounds.  Abdominal:     General: There is no distension.     Palpations: Abdomen is soft.     Tenderness: There is abdominal tenderness in the right upper quadrant.  Musculoskeletal:        General: Normal range of motion.     Cervical back: Normal range of motion.  Skin:    General: Skin is warm.  Neurological:     General: No focal deficit present.     Mental Status: She is alert and oriented to person, place, and time.  Psychiatric:        Mood and Affect: Mood normal.        Behavior: Behavior normal.        Thought Content: Thought content normal.        Judgment: Judgment normal.     Results: CLINICAL DATA:  20 year old female with right upper quadrant abdominal pain.  EXAM: ULTRASOUND ABDOMEN LIMITED RIGHT UPPER QUADRANT  COMPARISON:   None.  FINDINGS: Gallbladder:  There multiple stones within the gallbladder. No gallbladder wall thickening or pericholecystic fluid. Negative sonographic Murphy sign.  Common bile duct:  Diameter: 3 mm  Liver:  There is diffuse increased liver echogenicity most commonly seen in the setting of fatty infiltration. Superimposed inflammation or fibrosis is not excluded. Clinical correlation is recommended. Portal vein is patent on color Doppler imaging with normal direction  of blood flow towards the liver.  Other: None.  IMPRESSION: 1. Cholelithiasis without sonographic evidence of acute cholecystitis. 2. Fatty liver.   Electronically Signed   By: Arash  Radparvar M.D.   On: 02/05/2021 03:03   Assessment & Plan:  Bettyjo Fariss is a 20 y.o. female with type I diabetes and symptomatic gallstones. She wants to proceed with getting her gallbladder out. She has a dexcom blood glucose monitor in place but no pump. Based on my research this should be able to stay in place as there is research using this technology during surgery for blood glucose monitoring.  I will also reach out to her Endocrinology specialist Spenser Beasley, NP with pediatric endocrinology and discuss.  -PLAN: I counseled the patient about the indication, risks and benefits of laparoscopic cholecystectomy.  She understands there is a very small chance for bleeding, infection, injury to normal structures (including common bile duct), conversion to open surgery, persistent symptoms, evolution of postcholecystectomy diarrhea, need for secondary interventions, anesthesia reaction, cardiopulmonary issues and other risks not specifically detailed here. I described the expected recovery, the plan for follow-up and the restrictions during the recovery phase.  All questions were answered.  Discussed preop COVID testing.   All questions were answered to the satisfaction of the patient and family.   Ellisha Bankson C  Ilka Lovick 03/27/2021, 10:34 AM    

## 2021-03-27 NOTE — H&P (Signed)
[facility] History and Physical  Reason for Referral: Gallstones  Referring Physician:  Dr. Marletta Lor   Chief Complaint    New Patient (Initial Visit)      Krista Wang is a 20 y.o. female.  HPI:  Krista Wang is a 20 yo with type I DM who has been having issues with RUQ pain radiating to her shoulder and constipation. She was referred to Dr. Marletta Lor by Dr. Meredeth Ide, Randa Evens, MD her PCP.  She had an [place] that demonstrated gallstones and fatty liver.  She started having pain about 8 months ago and the attacks are more frequent now. She has some associated nausea. The pain has been getting more severe in the RUQ and is stabbing in nature.  She has chronic constipation and was recommended to do some Miralax by Dr. Marletta Lor.  She says that her symptoms all started with eating a steak and have continued whenever she eats beef.   She is a type 1 diabetic and does not have a pump. She does have a Dexcom blood glucose reader. She says her blood sugars are normally about 120 in the AM and if she does not eat would be about 105 by lunch.    Past Medical History:  Diagnosis Date  . Allergy   . Asthma   . Diabetes mellitus without complication (HCC)   . Gallstones   . Hypertension    Evaluated by [facility] in 2019, did not follow up or have an appt in July 2019 per Nephrology's recs  . Impacted ear wax 12/21/2015  . Irregular menses   . Nocturnal enuresis 07/01/2013  . Obesity   . Unspecified constipation 07/07/2013  . Weight gain 07/07/2013    Past Surgical History:  Procedure Laterality Date  . TONSILLECTOMY    . WISDOM TOOTH EXTRACTION      Family History  Problem Relation Age of Onset  . Diabetes Mother   . Hypertension Mother   . Diabetes Father   . Hypertension Father   . Hypertension Maternal Grandmother   . Stroke Maternal Grandmother   . Diabetes Maternal Grandmother   . Thyroid disease Maternal Grandmother   . Heart attack Maternal Grandfather   . Diabetes  Paternal Grandmother   . Hypertension Paternal Grandmother   . Dementia Paternal Grandmother   . Kidney failure Paternal Grandfather   . Hypertension Paternal Grandfather   . Diabetes Paternal Grandfather   . Thyroid disease Maternal Aunt     Social History   Tobacco Use  . Smoking status: Never Smoker  . Smokeless tobacco: Never Used  Substance Use Topics  . Alcohol use: No  . Drug use: No    Medications: I have reviewed the patient's current medications. Allergies as of 03/26/2021      Reactions   Blue Dyes (parenteral) Swelling      Medication List       Accurate as of Mar 26, 2021 11:59 PM. If you have any questions, ask your nurse or doctor.        Accu-Chek FastClix Lancets Misc USE 1 LANCET TO CHECK GLUCOSE SIX TIMES DAILY   Accu-Chek Guide test strip Generic drug: glucose blood USE TO CHECK GLUCOSE 6 TIMES DAILY   acetone (urine) test strip Check ketones per protocol   albuterol 108 (90 Base) MCG/ACT inhaler Commonly known as: VENTOLIN HFA 2 puffs before exercise or every 4 to 6 hours as needed for cough or wheezing What changed:   how much to take  how to take this  when to take this   Baqsimi One Pack 3 MG/DOSE Powd Generic drug: Glucagon PLACE 1 DOSE INTO THE NOSE AS NEEDED   BD Pen Needle Nano 2nd Gen 32G X 4 MM Misc Generic drug: Insulin Pen Needle USE 1 PEN NEEDLE SIX TIMES DAILY TO INJECT INSULIN   Dexcom G6 Sensor Misc USE AS DIRECTED CHANGE  SENSOR  EVERY  10  DAYS   Dexcom G6 Transmitter Misc USE AS DIRECTED EVERY 3 MONTHS   glucagon 1 MG injection Use for Severe Hypoglycemia . Inject 1 mg intramuscularly if unresponsive, unable to swallow, unconscious and/or has seizure What changed:   how much to take  how to take this  when to take this  reasons to take this   Lantus SoloStar 100 UNIT/ML Solostar Pen Generic drug: insulin glargine INJECT UP TO 50 UNITS SUBCUTANEOUSLY ONCE DAILY What changed:   how much to  take  how to take this  when to take this  additional instructions   norgestimate-ethinyl estradiol 0.25-35 MG-MCG tablet Commonly known as: Sprintec 28 Take 1 tablet by mouth daily. Patient overdue for yearly check up, needs to schedule   NovoLOG FlexPen 100 UNIT/ML FlexPen Generic drug: insulin aspart INJECT UP TO 50 UNITS SUBCUTANEOUSLY ONCE DAILY AS DIRECTED What changed: See the new instructions.        ROS:  A comprehensive review of systems was negative except for: Gastrointestinal: positive for abdominal pain, constipation and nausea  Blood pressure 109/75, pulse 87, temperature 98.2 F (36.8 C), temperature source Other (Comment), resp. rate 14, height 5\' 6"  (1.676 m), weight 224 lb (101.6 kg), SpO2 98 %. Physical Exam Vitals reviewed.  Constitutional:      Appearance: Normal appearance. She is obese.  HENT:     Head: Normocephalic.     Nose: Nose normal.  Eyes:     Extraocular Movements: Extraocular movements intact.  Cardiovascular:     Rate and Rhythm: Normal rate and regular rhythm.  Pulmonary:     Effort: Pulmonary effort is normal.     Breath sounds: Normal breath sounds.  Abdominal:     General: There is no distension.     Palpations: Abdomen is soft.     Tenderness: There is abdominal tenderness in the right upper quadrant.  Musculoskeletal:        General: Normal range of motion.     Cervical back: Normal range of motion.  Skin:    General: Skin is warm.  Neurological:     General: No focal deficit present.     Mental Status: She is alert and oriented to person, place, and time.  Psychiatric:        Mood and Affect: Mood normal.        Behavior: Behavior normal.        Thought Content: Thought content normal.        Judgment: Judgment normal.     Results: CLINICAL DATA:  20 year old female with right upper quadrant abdominal pain.  EXAM: ULTRASOUND ABDOMEN LIMITED RIGHT UPPER QUADRANT  COMPARISON:   None.  FINDINGS: Gallbladder:  There multiple stones within the gallbladder. No gallbladder wall thickening or pericholecystic fluid. Negative sonographic Murphy sign.  Common bile duct:  Diameter: 3 mm  Liver:  There is diffuse increased liver echogenicity most commonly seen in the setting of fatty infiltration. Superimposed inflammation or fibrosis is not excluded. Clinical correlation is recommended. Portal vein is patent on color Doppler imaging with normal direction  of blood flow towards the liver.  Other: None.  IMPRESSION: 1. Cholelithiasis without sonographic evidence of acute cholecystitis. 2. Fatty liver.   Electronically Signed   By: Elgie Collard M.D.   On: 02/05/2021 03:03   Assessment & Plan:  Krista Wang is a 20 y.o. female with type I diabetes and symptomatic gallstones. She wants to proceed with getting her gallbladder out. She has a dexcom blood glucose monitor in place but no pump. Based on my research this should be able to stay in place as there is research using this technology during surgery for blood glucose monitoring.  I will also reach out to her Endocrinology specialist Gretchen Short, NP with pediatric endocrinology and discuss.  -PLAN: I counseled the patient about the indication, risks and benefits of laparoscopic cholecystectomy.  She understands there is a very small chance for bleeding, infection, injury to normal structures (including common bile duct), conversion to open surgery, persistent symptoms, evolution of postcholecystectomy diarrhea, need for secondary interventions, anesthesia reaction, cardiopulmonary issues and other risks not specifically detailed here. I described the expected recovery, the plan for follow-up and the restrictions during the recovery phase.  All questions were answered.  Discussed preop COVID testing.   All questions were answered to the satisfaction of the patient and family.   Krista Wang 03/27/2021, 10:34 AM

## 2021-03-29 NOTE — Patient Instructions (Signed)
Minimally Invasive Cholecystectomy Minimally invasive cholecystectomy is surgery to remove the gallbladder. The gallbladder is a pear-shaped organ that lies beneath the liver on the right side of the body. The gallbladder stores bile, which is a fluid that helps the body digest fats. Cholecystectomy is often done to treat inflammation of the gallbladder (cholecystitis). This condition is usually caused by a buildup of gallstones (cholelithiasis) in the gallbladder. Gallstones can block the flow of bile, which can result in inflammation and pain. In severe cases, emergency surgery may be required. This procedure is done though small incisions in the abdomen, instead of one large incision. It is also called laparoscopic surgery. A thin scope with a camera (laparoscope) is inserted through one incision. Then surgical instruments are inserted through the other incisions. In some cases, a minimally invasive surgery may need to be changed to a surgery that is done through a larger incision. This is called open surgery. Tell a health care provider about:  Any allergies you have.  All medicines you are taking, including vitamins, herbs, eye drops, creams, and over-the-counter medicines.  Any problems you or family members have had with anesthetic medicines.  Any blood disorders you have.  Any surgeries you have had.  Any medical conditions you have.  Whether you are pregnant or may be pregnant. What are the risks? Generally, this is a safe procedure. However, problems may occur, including:  Infection.  Bleeding.  Allergic reactions to medicines.  Damage to nearby structures or organs.  A stone remaining in the common bile duct. The common bile duct carries bile from the gallbladder into the small intestine.  A bile leak from the cyst duct that is clipped when your gallbladder is removed.      Krista Wang  03/29/2021     @PREFPERIOPPHARMACY @   Your procedure is scheduled on   04/03/2021   Report to [facility] at  1040  A.M.   Call this number if you have problems the morning of surgery:  [phone]   Remember:  Do not eat or drink after midnight.                         Take these medicines the morning of surgery with A SIP OF WATER      NONE.   Take 17 units of Lantus the night before your procedure.  DO NOT take any medications for diabetes the morning of your procedure.  If your glucose is 70 or below the morning of your procedure, drink 1/2 cup of clear liquid containing sugar and recheck your glucose in 15 minutes. If your glucose is still 70 or below, call [phone] for instructions.  If your glucose is 300 or above the morning of your procedure, call [phone] for instructions.      Do not wear jewelry, make-up or nail polish.  Do not wear lotions, powders, or perfumes, or deodorant.  Do not shave 48 hours prior to surgery.  Men may shave face and neck.  Do not bring valuables to the hospital.  [facility] is not responsible for any belongings or valuables.  Contacts, dentures or bridgework may not be worn into surgery.  Leave your suitcase in the car.  After surgery it may be brought to your room.  For patients admitted to the hospital, discharge time will be determined by your treatment team.  Patients discharged the day of surgery will not be allowed to drive home and must have  someone with them for 24 hours.    Place clean sheets on your bed the night before your procedure and DO NOT sleep with pets this night.  Shower with  CHG the night before and the morning of your procedure and DO NOT use CHG on your face, hair or genitals.  After each shower, dry off with a clean towel, put on clean, comfortable clothes and brush your teeth.      Special instructions:  DO NOT smoke tobacco or vape for 24 hours before your procedure.  Please read over the following fact sheets that you were given. Anesthesia Post-op Instructions  and Care and Recovery After Surgery       Minimally Invasive Cholecystectomy, Care After This sheet gives you information about how to care for yourself after your procedure. Your health care provider may also give you more specific instructions. If you have problems or questions, contact your health care provider. What can I expect after the procedure? After the procedure, it is common to have:  Pain at your incision sites. You will be given medicines to control this pain.  Mild nausea or vomiting.  Bloating and possible shoulder pain from the gas that was used during the procedure. Follow these instructions at home: Medicines  Take over-the-counter and prescription medicines only as told by your health care provider.  If you were prescribed an antibiotic medicine, take or use it as told by your health care provider. Do not stop using the antibiotic even if you start to feel better.  Ask your health care provider if the medicine prescribed to you: ? Requires you to avoid driving or using machinery. ? Can cause constipation. You may need to take these actions to prevent or treat constipation:  Drink enough fluid to keep your urine pale yellow.  Take over-the-counter or prescription medicines.  Eat foods that are high in fiber, such as beans, whole grains, and fresh fruits and vegetables.  Limit foods that are high in fat and processed sugars, such as fried or sweet foods. Incision care  Follow instructions from your health care provider about how to take care of your incisions. Make sure you: ? Wash your hands with soap and water for at least 20 seconds before and after you change your bandage (dressing). If soap and water are not available, use hand sanitizer. ? Change your dressing as told by your health care provider. ? Leave stitches (sutures), skin glue, or adhesive strips in place. These skin closures may need to be in place for 2 weeks or longer. If adhesive strip edges  start to loosen and curl up, you may trim the loose edges. Do not remove adhesive strips completely unless your health care provider tells you to do that.  Do not take baths, swim, or use a hot tub until your health care provider approves. Ask your health care provider if you may take showers. You may only be allowed to take sponge baths.  Check your incision area every day for signs of infection. Check for: ? More redness, swelling, or pain. ? Fluid or blood. ? Warmth. ? Pus or a bad smell.   Activity  Rest as told by your health care provider.  Avoid sitting for a long time without moving. Get up to take short walks every 1-2 hours. This is important to improve blood flow and breathing. Ask for help if you feel weak or unsteady.  Do not lift anything that is heavier than 10 lb (4.5 kg),  or the limit that you are told, until your health care provider says that it is safe.  Do not play contact sports until your health care provider approves.  Do not return to work or school until your health care provider approves.  Return to your normal activities as told by your health care provider. Ask your health care provider what activities are safe for you. General instructions  If you were given a sedative during the procedure, it can affect you for several hours. Do not drive or operate machinery until your health care provider says that it is safe.  Keep all follow-up visits as told by your health care provider. This is important. Contact a health care provider if:  You develop a rash.  You have more redness, swelling, or pain around your incisions.  You have fluid or blood coming from your incisions.  Your incisions feel warm to the touch.  You have pus or a bad smell coming from your incisions.  You have a fever.  One or more of your incisions breaks open. Get help right away if:  You have trouble breathing.  You have chest pain.  You have increasing pain in your  shoulders.  You faint or feel dizzy when you stand.  You have severe pain in your abdomen.  You have nausea or vomiting that lasts for more than one day.  You have leg pain. Summary  After your procedure, it is common to have pain at the incision sites. You may also have nausea or bloating.  Follow your health care provider's instructions about medicine, activity restrictions, and caring for your incision areas. Do not do activities that require a lot of effort.  Contact a health care provider if you have a fever or other signs of infection, such as more redness, swelling, or pain around the incisions.  Get help right away if you have chest pain, increasing pain in the shoulders, or trouble breathing. This information is not intended to replace advice given to you by your health care provider. Make sure you discuss any questions you have with your health care provider. Document Revised: 08/03/2019 Document Reviewed: 08/03/2019 Elsevier Patient Education  2021 Elsevier Inc.   Medicines Ask your health care provider about:  Changing or stopping your regular medicines. This is especially important if you are taking diabetes medicines or blood thinners.  Taking medicines such as aspirin and ibuprofen. These medicines can thin your blood. Do not take these medicines unless your health care provider tells you to take them.  Taking over-the-counter medicines, vitamins, herbs, and supplements. General instructions  Let your health care provider know if you develop a cold or an infection before surgery.  Plan to have someone take you home from the hospital or clinic.  If you will be going home right after the procedure, plan to have someone with you for 24 hours.  Ask your health care provider: ? How your surgery site will be marked. ? What steps will be taken to help prevent infection. These may include:  Removing hair at the surgery site.  Washing skin with a germ-killing  soap.  Taking antibiotic medicine. What happens during the procedure?  An IV will be inserted into one of your veins.  You will be given one or both of the following: ? A medicine to help you relax (sedative). ? A medicine to make you fall asleep (general anesthetic).  A breathing tube will be placed in your mouth.  Your surgeon will  make several small incisions in your abdomen.  The laparoscope will be inserted through one of the small incisions. The camera on the laparoscope will send images to a monitor in the operating room. This lets your surgeon see inside your abdomen.  A gas will be pumped into your abdomen. This will expand your abdomen to give the surgeon more room to perform the surgery.  Other tools that are needed for the procedure will be inserted through the other incisions. The gallbladder will be removed through one of the incisions.  Your common bile duct may be examined. If stones are found in the common bile duct, they may be removed.  After your gallbladder has been removed, the incisions will be closed with stitches (sutures), staples, or skin glue.  Your incisions may be covered with a bandage (dressing). The procedure may vary among health care providers and hospitals.   What happens after the procedure?  Your blood pressure, heart rate, breathing rate, and blood oxygen level will be monitored until you leave the hospital or clinic.  You will be given medicines as needed to control your pain.  If you were given a sedative during the procedure, it can affect you for several hours. Do not drive or operate machinery until your health care provider says that it is safe. Summary  Minimally invasive cholecystectomy, also called laparoscopic cholecystectomy, is surgery to remove the gallbladder using small incisions.  Tell your health care provider about all the medical conditions you have and all the medicines you are taking for those conditions.  Before the  procedure, follow instructions about eating or drinking restrictions and changing or stopping medicines.  If you were given a sedative during the procedure, it can affect you for several hours. Do not drive or operate machinery until your health care provider says that it is safe. This information is not intended to replace advice given to you by your health care provider. Make sure you discuss any questions you have with your health care provider. Document Revised: 08/08/2019 Document Reviewed: 08/08/2019 Elsevier Patient Education  2021 Elsevier Inc. General Anesthesia, Adult, Care After This sheet gives you information about how to care for yourself after your procedure. Your health care provider may also give you more specific instructions. If you have problems or questions, contact your health care provider. What can I expect after the procedure? After the procedure, the following side effects are common:  Pain or discomfort at the IV site.  Nausea.  Vomiting.  Sore throat.  Trouble concentrating.  Feeling cold or chills.  Feeling weak or tired.  Sleepiness and fatigue.  Soreness and body aches. These side effects can affect parts of the body that were not involved in surgery. Follow these instructions at home: For the time period you were told by your health care provider:  Rest.  Do not participate in activities where you could fall or become injured.  Do not drive or use machinery.  Do not drink alcohol.  Do not take sleeping pills or medicines that cause drowsiness.  Do not make important decisions or sign legal documents.  Do not take care of children on your own.   Eating and drinking  Follow any instructions from your health care provider about eating or drinking restrictions.  When you feel hungry, start by eating small amounts of foods that are soft and easy to digest (bland), such as toast. Gradually return to your regular diet.  Drink enough fluid to  keep your urine  pale yellow.  If you vomit, rehydrate by drinking water, juice, or clear broth. General instructions  If you have sleep apnea, surgery and certain medicines can increase your risk for breathing problems. Follow instructions from your health care provider about wearing your sleep device: ? Anytime you are sleeping, including during daytime naps. ? While taking prescription pain medicines, sleeping medicines, or medicines that make you drowsy.  Have a responsible adult stay with you for the time you are told. It is important to have someone help care for you until you are awake and alert.  Return to your normal activities as told by your health care provider. Ask your health care provider what activities are safe for you.  Take over-the-counter and prescription medicines only as told by your health care provider.  If you smoke, do not smoke without supervision.  Keep all follow-up visits as told by your health care provider. This is important. Contact a health care provider if:  You have nausea or vomiting that does not get better with medicine.  You cannot eat or drink without vomiting.  You have pain that does not get better with medicine.  You are unable to pass urine.  You develop a skin rash.  You have a fever.  You have redness around your IV site that gets worse. Get help right away if:  You have difficulty breathing.  You have chest pain.  You have blood in your urine or stool, or you vomit blood. Summary  After the procedure, it is common to have a sore throat or nausea. It is also common to feel tired.  Have a responsible adult stay with you for the time you are told. It is important to have someone help care for you until you are awake and alert.  When you feel hungry, start by eating small amounts of foods that are soft and easy to digest (bland), such as toast. Gradually return to your regular diet.  Drink enough fluid to keep your urine pale  yellow.  Return to your normal activities as told by your health care provider. Ask your health care provider what activities are safe for you. This information is not intended to replace advice given to you by your health care provider. Make sure you discuss any questions you have with your health care provider. Document Revised: 07/19/2020 Document Reviewed: 02/16/2020 Elsevier Patient Education  2021 [employer]

## 2021-04-01 ENCOUNTER — Encounter (INDEPENDENT_AMBULATORY_CARE_PROVIDER_SITE_OTHER): Payer: Self-pay

## 2021-04-02 ENCOUNTER — Encounter (HOSPITAL_COMMUNITY)
Admission: RE | Admit: 2021-04-02 | Discharge: 2021-04-02 | Disposition: A | Discharge status: Home / Self Care | Payer: 59 | Source: Ambulatory Visit | Attending: General Surgery | Admitting: General Surgery

## 2021-04-02 ENCOUNTER — Other Ambulatory Visit: Payer: Self-pay

## 2021-04-02 ENCOUNTER — Encounter (HOSPITAL_COMMUNITY): Payer: Self-pay

## 2021-04-02 ENCOUNTER — Other Ambulatory Visit (HOSPITAL_COMMUNITY)
Admission: RE | Admit: 2021-04-02 | Discharge: 2021-04-02 | Disposition: A | Discharge status: Home / Self Care | Payer: 59 | Source: Ambulatory Visit | Attending: General Surgery | Admitting: General Surgery

## 2021-04-02 ENCOUNTER — Telehealth (INDEPENDENT_AMBULATORY_CARE_PROVIDER_SITE_OTHER): Payer: 59 | Admitting: General Surgery

## 2021-04-02 DIAGNOSIS — K8020 Calculus of gallbladder without cholecystitis without obstruction: Secondary | ICD-10-CM

## 2021-04-02 DIAGNOSIS — Z01812 Encounter for preprocedural laboratory examination: Secondary | ICD-10-CM | POA: Insufficient documentation

## 2021-04-02 DIAGNOSIS — Z01818 Encounter for other preprocedural examination: Secondary | ICD-10-CM | POA: Diagnosis not present

## 2021-04-02 DIAGNOSIS — Z20822 Contact with and (suspected) exposure to covid-19: Secondary | ICD-10-CM | POA: Insufficient documentation

## 2021-04-02 LAB — COMPREHENSIVE METABOLIC PANEL
ALT: 14 U/L (ref 0–44)
AST: 16 U/L (ref 15–41)
Albumin: 3.8 g/dL (ref 3.5–5.0)
Alkaline Phosphatase: 59 U/L (ref 38–126)
Anion gap: 9 (ref 5–15)
BUN: 5 mg/dL — ABNORMAL LOW (ref 6–20)
CO2: 28 mmol/L (ref 22–32)
Calcium: 8.9 mg/dL (ref 8.9–10.3)
Chloride: 100 mmol/L (ref 98–111)
Creatinine, Ser: 0.65 mg/dL (ref 0.44–1.00)
GFR, Estimated: >60 mL/min (ref >60)
Glucose, Bld: 175 mg/dL — ABNORMAL HIGH (ref 70–99)
Potassium: 4.2 mmol/L (ref 3.5–5.1)
Sodium: 137 mmol/L (ref 135–145)
Total Bilirubin: 0.6 mg/dL (ref 0.3–1.2)
Total Protein: 7.5 g/dL (ref 6.5–8.1)

## 2021-04-02 LAB — CBC WITH DIFFERENTIAL/PLATELET
Abs Immature Granulocytes: 0.02 10*3/uL (ref 0.00–0.07)
Basophils Absolute: 0.1 10*3/uL (ref 0.0–0.1)
Basophils Relative: 1 %
Eosinophils Absolute: 0.2 10*3/uL (ref 0.0–0.5)
Eosinophils Relative: 2 %
HCT: 40.9 % (ref 36.0–46.0)
Hemoglobin: 12.4 g/dL (ref 12.0–15.0)
Immature Granulocytes: 0 %
Lymphocytes Relative: 29 %
Lymphs Abs: 2.1 10*3/uL (ref 0.7–4.0)
MCH: 26.4 pg (ref 26.0–34.0)
MCHC: 30.3 g/dL (ref 30.0–36.0)
MCV: 87 fL (ref 80.0–100.0)
Monocytes Absolute: 0.6 10*3/uL (ref 0.1–1.0)
Monocytes Relative: 8 %
Neutro Abs: 4.3 10*3/uL (ref 1.7–7.7)
Neutrophils Relative %: 60 %
Platelets: 368 10*3/uL (ref 150–400)
RBC: 4.7 MIL/uL (ref 3.87–5.11)
RDW: 13.2 % (ref 11.5–15.5)
WBC: 7.1 10*3/uL (ref 4.0–10.5)
nRBC: 0 % (ref 0.0–0.2)

## 2021-04-02 LAB — HEMOGLOBIN A1C
Hgb A1c MFr Bld: 7.9 % — ABNORMAL HIGH (ref 4.8–5.6)
Mean Plasma Glucose: 180.03 mg/dL

## 2021-04-02 LAB — SARS CORONAVIRUS 2 (TAT 6-24 HRS): SARS Coronavirus 2: NEGATIVE

## 2021-04-02 LAB — HCG, QUANTITATIVE, PREGNANCY: hCG, Beta Chain, Quant, S: <1 m[IU]/mL (ref <5)

## 2021-04-02 LAB — GLUCOSE, CAPILLARY: Glucose-Capillary: 157 mg/dL — ABNORMAL HIGH (ref 70–99)

## 2021-04-02 NOTE — Telephone Encounter (Signed)
[facility]  Left patient a message regarding Mr. Francena Hanly recommendations for Surgery.  "Decrease her long acting insulin by 2 units the night before surgery just to prevent hypoglycemia." She can also wear her Monitor.   I was able to speak to her mother.   Algis Greenhouse, MD [facility] [address] Vella Raring [place], [place] [address] [phone] (office)

## 2021-04-03 ENCOUNTER — Encounter (HOSPITAL_COMMUNITY): Payer: Self-pay | Admitting: General Surgery

## 2021-04-03 ENCOUNTER — Ambulatory Visit (HOSPITAL_COMMUNITY): Payer: 59 | Admitting: Certified Registered Nurse Anesthetist

## 2021-04-03 ENCOUNTER — Ambulatory Visit (HOSPITAL_COMMUNITY)
Admission: RE | Admit: 2021-04-03 | Discharge: 2021-04-03 | Disposition: A | Discharge status: Home / Self Care | Payer: 59 | Attending: General Surgery | Admitting: General Surgery

## 2021-04-03 ENCOUNTER — Encounter (HOSPITAL_COMMUNITY)
Admission: RE | Disposition: A | Discharge status: Home / Self Care | Payer: Self-pay | Source: Home / Self Care | Attending: General Surgery

## 2021-04-03 DIAGNOSIS — E109 Type 1 diabetes mellitus without complications: Secondary | ICD-10-CM | POA: Diagnosis not present

## 2021-04-03 DIAGNOSIS — K8010 Calculus of gallbladder with chronic cholecystitis without obstruction: Secondary | ICD-10-CM | POA: Insufficient documentation

## 2021-04-03 DIAGNOSIS — K5909 Other constipation: Secondary | ICD-10-CM | POA: Insufficient documentation

## 2021-04-03 DIAGNOSIS — K760 Fatty (change of) liver, not elsewhere classified: Secondary | ICD-10-CM | POA: Diagnosis not present

## 2021-04-03 DIAGNOSIS — K8020 Calculus of gallbladder without cholecystitis without obstruction: Secondary | ICD-10-CM | POA: Diagnosis present

## 2021-04-03 DIAGNOSIS — Z794 Long term (current) use of insulin: Secondary | ICD-10-CM | POA: Insufficient documentation

## 2021-04-03 HISTORY — PX: CHOLECYSTECTOMY: SHX55

## 2021-04-03 LAB — GLUCOSE, CAPILLARY
Glucose-Capillary: 177 mg/dL — ABNORMAL HIGH (ref 70–99)
Glucose-Capillary: 169 mg/dL — ABNORMAL HIGH (ref 70–99)

## 2021-04-03 SURGERY — LAPAROSCOPIC CHOLECYSTECTOMY
Anesthesia: General | Site: Abdomen

## 2021-04-03 MED ORDER — DEXAMETHASONE SODIUM PHOSPHATE 10 MG/ML IJ SOLN
INTRAMUSCULAR | Status: AC
  Filled 2021-04-03: qty 1

## 2021-04-03 MED ORDER — MIDAZOLAM HCL 2 MG/2ML IJ SOLN
INTRAMUSCULAR | Status: AC
  Filled 2021-04-03: qty 2

## 2021-04-03 MED ORDER — CHLORHEXIDINE GLUCONATE 0.12 % MT SOLN
15.0000 mL | Freq: Once | OROMUCOSAL | Status: AC
  Administered 2021-04-03: 15 mL via OROMUCOSAL

## 2021-04-03 MED ORDER — BUPIVACAINE HCL (PF) 0.5 % IJ SOLN
INTRAMUSCULAR | Status: DC | PRN
  Administered 2021-04-03: 10 mL

## 2021-04-03 MED ORDER — KETOROLAC TROMETHAMINE 30 MG/ML IJ SOLN
INTRAMUSCULAR | Status: DC | PRN
  Administered 2021-04-03: 30 mg via INTRAVENOUS

## 2021-04-03 MED ORDER — SODIUM CHLORIDE 0.9 % IV SOLN
2.0000 g | INTRAVENOUS | Status: AC
  Administered 2021-04-03: 2 g via INTRAVENOUS

## 2021-04-03 MED ORDER — SODIUM CHLORIDE 0.9 % IR SOLN
Status: DC | PRN
  Administered 2021-04-03: 1000 mL

## 2021-04-03 MED ORDER — ONDANSETRON HCL 4 MG/2ML IJ SOLN: 4.0000 mg | Freq: Once | INTRAMUSCULAR | Status: DC | PRN

## 2021-04-03 MED ORDER — ROCURONIUM BROMIDE 10 MG/ML (PF) SYRINGE
PREFILLED_SYRINGE | INTRAVENOUS | Status: DC | PRN
  Administered 2021-04-03: 60 mg via INTRAVENOUS

## 2021-04-03 MED ORDER — DEXAMETHASONE SODIUM PHOSPHATE 10 MG/ML IJ SOLN
INTRAMUSCULAR | Status: DC | PRN
  Administered 2021-04-03: 5 mg via INTRAVENOUS

## 2021-04-03 MED ORDER — SODIUM CHLORIDE 0.9 % IV SOLN
INTRAVENOUS | Status: AC
  Filled 2021-04-03: qty 2

## 2021-04-03 MED ORDER — PROPOFOL 10 MG/ML IV BOLUS
INTRAVENOUS | Status: DC | PRN
  Administered 2021-04-03: 200 mg via INTRAVENOUS

## 2021-04-03 MED ORDER — LACTATED RINGERS IV SOLN
INTRAVENOUS | Status: DC
  Administered 2021-04-03: 1000 mL via INTRAVENOUS

## 2021-04-03 MED ORDER — SUGAMMADEX SODIUM 500 MG/5ML IV SOLN
INTRAVENOUS | Status: DC | PRN
  Administered 2021-04-03: 200 mg via INTRAVENOUS

## 2021-04-03 MED ORDER — DEXMEDETOMIDINE (PRECEDEX) IN NS 20 MCG/5ML (4 MCG/ML) IV SYRINGE
PREFILLED_SYRINGE | INTRAVENOUS | Status: AC
  Filled 2021-04-03: qty 10

## 2021-04-03 MED ORDER — BUPIVACAINE HCL (PF) 0.5 % IJ SOLN
INTRAMUSCULAR | Status: AC
  Filled 2021-04-03: qty 30

## 2021-04-03 MED ORDER — HEMOSTATIC AGENTS (NO CHARGE) OPTIME
TOPICAL | Status: DC | PRN
  Administered 2021-04-03: 1 via TOPICAL

## 2021-04-03 MED ORDER — ONDANSETRON HCL 4 MG PO TABS: 4.0000 mg | ORAL_TABLET | Freq: Three times a day (TID) | ORAL | 1 refills | Status: AC | PRN

## 2021-04-03 MED ORDER — LIDOCAINE HCL (PF) 2 % IJ SOLN
INTRAMUSCULAR | Status: AC
  Filled 2021-04-03: qty 5

## 2021-04-03 MED ORDER — CHLORHEXIDINE GLUCONATE CLOTH 2 % EX PADS: 6.0000 | MEDICATED_PAD | Freq: Once | CUTANEOUS | Status: DC

## 2021-04-03 MED ORDER — DEXMEDETOMIDINE (PRECEDEX) IN NS 20 MCG/5ML (4 MCG/ML) IV SYRINGE
PREFILLED_SYRINGE | INTRAVENOUS | Status: DC | PRN
  Administered 2021-04-03: 12 ug via INTRAVENOUS
  Administered 2021-04-03: 20 ug via INTRAVENOUS

## 2021-04-03 MED ORDER — ORAL CARE MOUTH RINSE: 15.0000 mL | Freq: Once | OROMUCOSAL | Status: AC

## 2021-04-03 MED ORDER — FENTANYL CITRATE (PF) 100 MCG/2ML IJ SOLN
INTRAMUSCULAR | Status: DC | PRN
  Administered 2021-04-03: 50 ug via INTRAVENOUS
  Administered 2021-04-03: 150 ug via INTRAVENOUS

## 2021-04-03 MED ORDER — ROCURONIUM BROMIDE 10 MG/ML (PF) SYRINGE
PREFILLED_SYRINGE | INTRAVENOUS | Status: AC
  Filled 2021-04-03: qty 10

## 2021-04-03 MED ORDER — DIPHENHYDRAMINE HCL 50 MG/ML IJ SOLN
INTRAMUSCULAR | Status: DC | PRN
  Administered 2021-04-03: 12.5 mg via INTRAVENOUS

## 2021-04-03 MED ORDER — PHENYLEPHRINE HCL (PRESSORS) 10 MG/ML IV SOLN
INTRAVENOUS | Status: DC | PRN
  Administered 2021-04-03: 80 ug via INTRAVENOUS

## 2021-04-03 MED ORDER — SCOPOLAMINE 1 MG/3DAYS TD PT72
MEDICATED_PATCH | TRANSDERMAL | Status: AC
  Filled 2021-04-03: qty 1

## 2021-04-03 MED ORDER — LIDOCAINE HCL (CARDIAC) PF 100 MG/5ML IV SOSY
PREFILLED_SYRINGE | INTRAVENOUS | Status: DC | PRN
  Administered 2021-04-03: 60 mg via INTRATRACHEAL

## 2021-04-03 MED ORDER — OXYCODONE HCL 5 MG PO TABS: 5.0000 mg | ORAL_TABLET | ORAL | 0 refills | Status: AC | PRN

## 2021-04-03 MED ORDER — SCOPOLAMINE 1 MG/3DAYS TD PT72
1.0000 | MEDICATED_PATCH | Freq: Once | TRANSDERMAL | Status: DC
  Administered 2021-04-03: 1.5 mg via TRANSDERMAL

## 2021-04-03 MED ORDER — FENTANYL CITRATE (PF) 250 MCG/5ML IJ SOLN
INTRAMUSCULAR | Status: AC
  Filled 2021-04-03: qty 5

## 2021-04-03 MED ORDER — ONDANSETRON HCL 4 MG/2ML IJ SOLN
INTRAMUSCULAR | Status: DC | PRN
  Administered 2021-04-03: 4 mg via INTRAVENOUS

## 2021-04-03 MED ORDER — FENTANYL CITRATE (PF) 100 MCG/2ML IJ SOLN
25.0000 ug | INTRAMUSCULAR | Status: DC | PRN
  Administered 2021-04-03: 50 ug via INTRAVENOUS
  Filled 2021-04-03: qty 2

## 2021-04-03 MED ORDER — ONDANSETRON HCL 4 MG/2ML IJ SOLN
INTRAMUSCULAR | Status: AC
  Filled 2021-04-03: qty 2

## 2021-04-03 MED ORDER — MIDAZOLAM HCL 2 MG/2ML IJ SOLN
INTRAMUSCULAR | Status: DC | PRN
  Administered 2021-04-03: 2 mg via INTRAVENOUS

## 2021-04-03 SURGICAL SUPPLY — 42 items
ADH SKN CLS APL DERMABOND .7 (GAUZE/BANDAGES/DRESSINGS) ×1
APL PRP STRL LF DISP 70% ISPRP (MISCELLANEOUS) ×1
APPLIER CLIP ROT 10 11.4 M/L (STAPLE) ×2
APR CLP MED LRG 11.4X10 (STAPLE) ×1
BAG RETRIEVAL 10 (BASKET) ×1
BLADE SURG 15 STRL LF DISP TIS (BLADE) ×1 IMPLANT
BLADE SURG 15 STRL SS (BLADE) ×2
CHLORAPREP W/TINT 26 (MISCELLANEOUS) ×2 IMPLANT
CLIP APPLIE ROT 10 11.4 M/L (STAPLE) ×1 IMPLANT
CLOTH BEACON ORANGE TIMEOUT ST (SAFETY) ×2 IMPLANT
COVER LIGHT HANDLE STERIS (MISCELLANEOUS) ×4 IMPLANT
COVER WAND RF STERILE (DRAPES) ×2 IMPLANT
DECANTER SPIKE VIAL GLASS SM (MISCELLANEOUS) ×2 IMPLANT
DERMABOND ADVANCED (GAUZE/BANDAGES/DRESSINGS) ×1
DERMABOND ADVANCED .7 DNX12 (GAUZE/BANDAGES/DRESSINGS) ×1 IMPLANT
ELECT REM PT RETURN 9FT ADLT (ELECTROSURGICAL) ×2
ELECTRODE REM PT RTRN 9FT ADLT (ELECTROSURGICAL) ×1 IMPLANT
GLOVE SURG ENC MOIS LTX SZ6.5 (GLOVE) ×2 IMPLANT
GLOVE SURG UNDER POLY LF SZ6.5 (GLOVE) ×2 IMPLANT
GLOVE SURG UNDER POLY LF SZ7 (GLOVE) ×6 IMPLANT
GOWN STRL REUS W/TWL LRG LVL3 (GOWN DISPOSABLE) ×6 IMPLANT
HEMOSTAT SNOW SURGICEL 2X4 (HEMOSTASIS) ×2 IMPLANT
INST SET LAPROSCOPIC AP (KITS) ×2 IMPLANT
KIT TURNOVER KIT A (KITS) ×2 IMPLANT
MANIFOLD NEPTUNE II (INSTRUMENTS) ×2 IMPLANT
NDL INSUFFLATION 14GA 120MM (NEEDLE) ×1 IMPLANT
NEEDLE INSUFFLATION 14GA 120MM (NEEDLE) ×2 IMPLANT
NS IRRIG 1000ML POUR BTL (IV SOLUTION) ×2 IMPLANT
PACK LAP CHOLE LZT030E (CUSTOM PROCEDURE TRAY) ×2 IMPLANT
PAD ARMBOARD 7.5X6 YLW CONV (MISCELLANEOUS) ×2 IMPLANT
SET BASIN LINEN APH (SET/KITS/TRAYS/PACK) ×2 IMPLANT
SET TUBE SMOKE EVAC HIGH FLOW (TUBING) ×2 IMPLANT
SLEEVE ENDOPATH XCEL 5M (ENDOMECHANICALS) ×2 IMPLANT
SUT MNCRL AB 4-0 PS2 18 (SUTURE) ×4 IMPLANT
SUT VICRYL 0 UR6 27IN ABS (SUTURE) ×2 IMPLANT
SYS BAG RETRIEVAL 10MM (BASKET) ×1
SYSTEM BAG RETRIEVAL 10MM (BASKET) ×1 IMPLANT
TROCAR ENDO BLADELESS 11MM (ENDOMECHANICALS) ×2 IMPLANT
TROCAR XCEL NON-BLD 5MMX100MML (ENDOMECHANICALS) ×2 IMPLANT
TROCAR XCEL UNIV SLVE 11M 100M (ENDOMECHANICALS) ×2 IMPLANT
TUBE CONNECTING 12X1/4 (SUCTIONS) ×2 IMPLANT
WARMER LAPAROSCOPE (MISCELLANEOUS) ×2 IMPLANT

## 2021-04-03 NOTE — Progress Notes (Signed)
[facility]  Updated mom. Rx sent to pharmacy. Will do post op phone call 6/2 unless patient needs or wants to be seen in person.  Algis Greenhouse, MD [facility] [address] [place], [place] [address] [phone] (office)

## 2021-04-03 NOTE — Anesthesia Preprocedure Evaluation (Signed)
Anesthesia Evaluation  Patient identified by MRN, date of birth, ID band Patient awake    Reviewed: Allergy & Precautions, H&P , NPO status , Patient's Chart, lab work & pertinent test results, reviewed documented beta blocker date and time   Airway Mallampati: II  TM Distance: >3 FB Neck ROM: full    Dental no notable dental hx. (+) Teeth Intact   Pulmonary asthma ,    Pulmonary exam normal breath sounds clear to auscultation       Cardiovascular Exercise Tolerance: Good hypertension, negative cardio ROS   Rhythm:regular Rate:Normal     Neuro/Psych PSYCHIATRIC DISORDERS negative neurological ROS     GI/Hepatic negative GI ROS, Neg liver ROS,   Endo/Other  negative endocrine ROSdiabetes, Poorly Controlled, Type 1  Renal/GU negative Renal ROS  negative genitourinary   Musculoskeletal   Abdominal   Peds  Hematology negative hematology ROS (+)   Anesthesia Other Findings   Reproductive/Obstetrics negative OB ROS                             Anesthesia Physical Anesthesia Plan  ASA: II  Anesthesia Plan: General   Post-op Pain Management:    Induction:   PONV Risk Score and Plan: Ondansetron  Airway Management Planned:   Additional Equipment:   Intra-op Plan:   Post-operative Plan:   Informed Consent: I have reviewed the patients History and Physical, chart, labs and discussed the procedure including the risks, benefits and alternatives for the proposed anesthesia with the patient or authorized representative who has indicated his/her understanding and acceptance.     Dental Advisory Given  Plan Discussed with: CRNA  Anesthesia Plan Comments:         Anesthesia Quick Evaluation

## 2021-04-03 NOTE — Anesthesia Procedure Notes (Signed)
Procedure Name: Intubation Date/Time: 04/03/2021 12:24 PM Performed by: Karna Dupes, CRNA Pre-anesthesia Checklist: Patient identified, Emergency Drugs available, Suction available and Patient being monitored Patient Re-evaluated:Patient Re-evaluated prior to induction Oxygen Delivery Method: Circle system utilized Preoxygenation: Pre-oxygenation with 100% oxygen Induction Type: IV induction Ventilation: Mask ventilation without difficulty Laryngoscope Size: Mac and 3 Grade View: Grade II Tube type: Oral Number of attempts: 1 Airway Equipment and Method: Stylet Placement Confirmation: ETT inserted through vocal cords under direct vision,  positive ETCO2 and breath sounds checked- equal and bilateral Secured at: 21 cm Tube secured with: Tape Dental Injury: Teeth and Oropharynx as per pre-operative assessment

## 2021-04-03 NOTE — Transfer of Care (Signed)
Immediate Anesthesia Transfer of Care Note  Patient: Analiya Gilland  Procedure(s) Performed: LAPAROSCOPIC CHOLECYSTECTOMY (N/A Abdomen)  Patient Location: PACU  Anesthesia Type:General  Level of Consciousness: drowsy  Airway & Oxygen Therapy: Patient Spontanous Breathing and Patient connected to face mask oxygen  Post-op Assessment: Report given to RN and Post -op Vital signs reviewed and stable  Post vital signs: Reviewed and stable  Last Vitals:  Vitals Value Taken Time  BP 124/67 04/03/21 1321  Temp    Pulse 81 04/03/21 1323  Resp 20 04/03/21 1322  SpO2 100 % 04/03/21 1323  Vitals shown include unvalidated device data.  Last Pain:  Vitals:   04/03/21 1125  TempSrc: Oral  PainSc: 0-No pain      Patients Stated Pain Goal: 8 (04/03/21 1125)  Complications: No complications documented.

## 2021-04-03 NOTE — Op Note (Signed)
Operative Note   Preoperative Diagnosis: Symptomatic cholelithiasis   Postoperative Diagnosis: Same   Procedure(s) Performed: Laparoscopic cholecystectomy   Surgeon: Lillia Abed C. Henreitta Leber, MD   Assistants: No qualified resident was available   Anesthesia: General endotracheal   Anesthesiologist: No responsible provider has been recorded for the case.    Specimens: Gallbladder    Estimated Blood Loss: Minimal    Blood Replacement: None    Complications: None    Operative Findings: Gallbladder with some hyperemic wall signs indicating some chronic inflammation    Procedure: The patient was taken to the operating room and placed supine. General endotracheal anesthesia was induced. Intravenous antibiotics were administered per protocol. An orogastric tube positioned to decompress the stomach. The abdomen was prepared and draped in the usual sterile fashion.    A supraumbilical incision was made and a Veress technique was utilized to achieve pneumoperitoneum to 15 mmHg with carbon dioxide. A 11 mm optiview port was placed through the supraumbilical region, and a 10 mm 0-degree operative laparoscope was introduced. The area underlying the trocar and Veress needle were inspected and without evidence of injury.  Remaining trocars were placed under direct vision. Two 5 mm ports were placed in the right abdomen, between the anterior axillary and midclavicular line.  A final 11 mm port was placed through the mid-epigastrium, near the falciform ligament.    The gallbladder fundus was elevated cephalad and the infundibulum was retracted to the patient's right. The gallbladder/cystic duct junction was skeletonized. The cystic artery noted in the triangle of Calot and was also skeletonized.  We then continued liberal medial and lateral dissection until the critical view of safety was achieved.    The cystic duct and cystic artery were triply clipped and divided. The gallbladder was then dissected from  the liver bed with electrocautery. The specimen was placed in an Endopouch and was retrieved through the epigastric site.   Final inspection revealed acceptable hemostasis. Surgical SNOW was placed in the gallbladder bed.  Trocars were removed and pneumoperitoneum was released. The epigastric and umbilical port sites were smaller than my finger tip. Marcaine was injected. Skin incisions were closed with 4-0 Monocryl subcuticular sutures and Dermabond. The patient was awakened from anesthesia and extubated without complication.    Algis Greenhouse, MD [facility] [address] [place], [place] [address] [phone] (office)

## 2021-04-03 NOTE — Discharge Instructions (Signed)
Discharge Laparoscopic Surgery Instructions:  Common Complaints: Right shoulder pain is common after laparoscopic surgery. This is secondary to the gas used in the surgery being trapped under the diaphragm.  Walk to help your body absorb the gas. This will improve in a few days. Pain at the port sites are common, especially the larger port sites. This will improve with time.  Some nausea is common and poor appetite. The main goal is to stay hydrated the first few days after surgery.   Diet/ Activity: Diet as tolerated. You may not have an appetite, but it is important to stay hydrated. Drink 64 ounces of water a day. Your appetite will return with time.  Shower per your regular routine daily.  Do not take hot showers. Take warm showers that are less than 10 minutes. Rest and listen to your body, but do not remain in bed all day.  Walk everyday for at least 15-20 minutes. Deep cough and move around every 1-2 hours in the first few days after surgery.  Do not lift > 10 lbs, perform excessive bending, pushing, pulling, squatting for 1-2 weeks after surgery.  Do not pick at the dermabond glue on your incision sites.  This glue film will remain in place for 1-2 weeks and will start to peel off.  Do not place lotions or balms on your incision unless instructed to specifically by Dr. Bridges.   Pain Expectations and Narcotics: -After surgery you will have pain associated with your incisions and this is normal. The pain is muscular and nerve pain, and will get better with time. -You are encouraged and expected to take non narcotic medications like tylenol and ibuprofen (when able) to treat pain as multiple modalities can aid with pain treatment. -Narcotics are only used when pain is severe or there is breakthrough pain. -You are not expected to have a pain score of 0 after surgery, as we cannot prevent pain. A pain score of 3-4 that allows you to be functional, move, walk, and tolerate some activity is  the goal. The pain will continue to improve over the days after surgery and is dependent on your surgery. -Due to [place] law, we are only able to give a certain amount of pain medication to treat post operative pain, and we only give additional narcotics on a patient by patient basis.  -For most laparoscopic surgery, studies have shown that the majority of patients only need 10-15 narcotic pills, and for open surgeries most patients only need 15-20.   -Having appropriate expectations of pain and knowledge of pain management with non narcotics is important as we do not want anyone to become addicted to narcotic pain medication.  -Using ice packs in the first 48 hours and heating pads after 48 hours, wearing an abdominal binder (when recommended), and using over the counter medications are all ways to help with pain management.   -Simple acts like meditation and mindfulness practices after surgery can also help with pain control and research has proven the benefit of these practices.  Medication: Take tylenol and ibuprofen as needed for pain control, alternating every 4-6 hours.  Example:  Tylenol 1000mg @ 6am, 12noon, 6pm, 12midnight (Do not exceed 4000mg of tylenol a day). Ibuprofen 800mg @ 9am, 3pm, 9pm, 3am (Do not exceed 3600mg of ibuprofen a day).  Take Roxicodone for breakthrough pain every 4 hours.  Take Colace for constipation related to narcotic pain medication. If you do not have a bowel movement in 2 days, take Miralax   over the counter.  Drink plenty of water to also prevent constipation.   Contact Information: If you have questions or concerns, please call our office, [phone], Monday- Thursday 8AM-5PM and Friday 8AM-12Noon.  If it is after hours or on the weekend, please call [facility], [phone], [phone], and ask to speak to the surgeon on call for Dr. Constance Haw at [facility].    Minimally Invasive Cholecystectomy, Care After This sheet gives you information about how  to care for yourself after your procedure. Your doctor may also give you more specific instructions. If you have problems or questions, contact your doctor. What can I expect after the procedure? After the procedure, it is common:  To have pain at the areas of surgery. You will be given medicines for pain.  To vomit or feel like you may vomit.  To feel fullness in the belly (bloating) or to have pain in the shoulder. This comes from the gas that was used during the surgery. Follow these instructions at home: Medicines  Take over-the-counter and prescription medicines only as told by your doctor.  If you were prescribed an antibiotic medicine, take it as told by your doctor. Do not stop using the antibiotic even if you start to feel better.  Ask your doctor if the medicine prescribed to you: ? Requires you to avoid driving or using machinery. ? Can cause trouble pooping (constipation). You may need to take these actions to prevent or treat trouble pooping:  Drink enough fluid to keep your pee (urine) pale yellow.  Take over-the-counter or prescription medicines.  Eat foods that are high in fiber. These include beans, whole grains, and fresh fruits and vegetables.  Limit foods that are high in fat and sugar. These include fried or sweet foods. Incision care  Do not take baths, swim, or use a hot tub until your doctor approves. You may shower.  Check your surgery area every day for signs of infection. Check for: ? More redness, swelling, or pain. ? Fluid or blood. ? Warmth. ? Pus or a bad smell.   Activity  Rest as told by your doctor.  Do not sit for a long time without moving. Get up to take short walks every 1-2 hours. This is important. Ask for help if you feel weak or unsteady.  Do not lift anything that is heavier than 10 lb (4.5 kg), or the limit that you are told, until your doctor says that it is safe.  Do not play contact sports until your doctor says it is  okay.  Do not return to work or school until your doctor says it is okay.  Return to your normal activities as told by your doctor. Ask your doctor what activities are safe for you. General instructions  If you were given a medicine to help you relax (sedative) during your procedure, it can affect you for many hours. Do not drive or use machinery until your doctor says that it is safe.  Keep all follow-up visits as told by your doctor. This is important. Contact a doctor if:  You get a rash.  You have more redness, swelling, or pain around your cuts from surgery.  You have fluid or blood coming from your cuts from surgery.  Your cuts from surgery feel warm to the touch.  You have pus or a bad smell coming from your cuts from surgery.  You have a fever.  One or more of your cuts from surgery breaks open. Get  help right away if:  You have trouble breathing.  You have chest pain.  You have pain that is getting worse in your shoulders.  You faint or feel dizzy when you stand.  You have very bad pain in your belly (abdomen).  You feel like you may vomit or you vomit, and this lasts for more than one day.  You have leg pain. Summary  After your surgery, it is common to have pain at the areas of surgery. You may also have vomiting or fullness in the belly.  Follow your doctor's instructions about medicine, activity restrictions, and caring for your surgery areas. Do not do activities that require a lot of effort.  Contact a doctor if you have a fever or other signs of infection, such as more redness, swelling, or pain around the cuts from surgery.  Get help right away if you have chest pain, increasing pain in the shoulders, or trouble breathing. This information is not intended to replace advice given to you by your health care provider. Make sure you discuss any questions you have with your health care provider. Document Revised: 10/18/2019 Document Reviewed:  10/18/2019 Elsevier Patient Education  2021 [employer]vier Inc.  PATIENT INSTRUCTIONS POST-ANESTHESIA  IMMEDIATELY FOLLOWING SURGERY:  Do not drive or operate machinery for the first twenty four hours after surgery.  Do not make any important decisions for twenty four hours after surgery or while taking narcotic pain medications or sedatives.  If you develop intractable nausea and vomiting or a severe headache please notify your doctor immediately.  FOLLOW-UP:  Please make an appointment with your surgeon as instructed. You do not need to follow up with anesthesia unless specifically instructed to do so.  WOUND CARE INSTRUCTIONS (if applicable):  Keep a dry clean dressing on the anesthesia/puncture wound site if there is drainage.  Once the wound has quit draining you may leave it open to air.  Generally you should leave the bandage intact for twenty four hours unless there is drainage.  If the epidural site drains for more than 36-48 hours please call the anesthesia department.  QUESTIONS?:  Please feel free to call your physician or the hospital operator if you have any questions, and they will be happy to assist you.       PLEASE REMOVE THE SCOPOLAMINE PATCH BEHIND YOUR RIGHT EAR ON Saturday MAY 21,2022. WASH HANDS AFTER REMOVAL  Scopolamine skin patches What is this medicine? SCOPOLAMINE (skoe POL a meen) is used to prevent nausea and vomiting caused by motion sickness, anesthesia and surgery. This medicine may be used for other purposes; ask your health care provider or pharmacist if you have questions. COMMON BRAND NAME(S): Transderm Scop What should I tell my health care provider before I take this medicine? They need to know if you have any of these conditions:  are scheduled to have a gastric secretion test  glaucoma  heart disease  kidney disease  liver disease  lung or breathing disease, like asthma  mental illness  prostate disease  seizures  stomach or intestine  problems  trouble passing urine  an unusual or allergic reaction to scopolamine, atropine, other medicines, foods, dyes, or preservatives  pregnant or trying to get pregnant  breast-feeding How should I use this medicine? This medicine is for external use only. Follow the directions on the prescription label. Wear only 1 patch at a time. Choose an area behind the ear, that is clean, dry, hairless and free from any cuts or irritation. Wipe  the area with a clean dry tissue. Peel off the plastic backing of the skin patch, trying not to touch the adhesive side with your hands. Do not cut the patches. Firmly apply to the area you have chosen, with the metallic side of the patch to the skin and the tan-colored side showing. Once firmly in place, wash your hands well with soap and water. Do not get this medicine into your eyes. After removing the patch, wash your hands and the area behind your ear thoroughly with soap and water. The patch will still contain some medicine after use. To avoid accidental contact or ingestion by children or pets, fold the used patch in half with the sticky side together and throw away in the trash out of the reach of children and pets. If you need to use a second patch after you remove the first, place it behind the other ear. A special MedGuide will be given to you by the pharmacist with each prescription and refill. Be sure to read this information carefully each time. Talk to your pediatrician regarding the use of this medicine in children. Special care may be needed. Overdosage: If you think you have taken too much of this medicine contact a poison control center or emergency room at once. NOTE: This medicine is only for you. Do not share this medicine with others. What if I miss a dose? This does not apply. This medicine is not for regular use. What may interact with this medicine?  alcohol  antihistamines for allergy cough and cold  atropine  certain medicines  for anxiety or sleep  certain medicines for bladder problems like oxybutynin, tolterodine  certain medicines for depression like amitriptyline, fluoxetine, sertraline  certain medicines for stomach problems like dicyclomine, hyoscyamine  certain medicines for Parkinson's disease like benztropine, trihexyphenidyl  certain medicines for seizures like phenobarbital, primidone  general anesthetics like halothane, isoflurane, methoxyflurane, propofol  ipratropium  local anesthetics like lidocaine, pramoxine, tetracaine  medicines that relax muscles for surgery  phenothiazines like chlorpromazine, mesoridazine, prochlorperazine, thioridazine  narcotic medicines for pain  other belladonna alkaloids This list may not describe all possible interactions. Give your health care provider a list of all the medicines, herbs, non-prescription drugs, or dietary supplements you use. Also tell them if you smoke, drink alcohol, or use illegal drugs. Some items may interact with your medicine. What should I watch for while using this medicine? Limit contact with water while swimming and bathing because the patch may fall off. If the patch falls off, throw it away and put a new one behind the other ear. You may get drowsy or dizzy. Do not drive, use machinery, or do anything that needs mental alertness until you know how this medicine affects you. Do not stand or sit up quickly, especially if you are an older patient. This reduces the risk of dizzy or fainting spells. Alcohol may interfere with the effect of this medicine. Avoid alcoholic drinks. Your mouth may get dry. Chewing sugarless gum or sucking hard candy, and drinking plenty of water may help. Contact your healthcare professional if the problem does not go away or is severe. This medicine may cause dry eyes and blurred vision. If you wear contact lenses, you may feel some discomfort. Lubricating drops may help. See your healthcare professional if  the problem does not go away or is severe. If you are going to need surgery, an MRI, CT scan, or other procedure, tell your healthcare professional that you are using this  medicine. You may need to remove the patch before the procedure. What side effects may I notice from receiving this medicine? Side effects that you should report to your doctor or health care professional as soon as possible:  allergic reactions like skin rash, itching or hives; swelling of the face, lips, or tongue  blurred vision  changes in vision  confusion  dizziness  eye pain  fast, irregular heartbeat  hallucinations, loss of contact with reality  nausea, vomiting  pain or trouble passing urine  restlessness  seizures  skin irritation  stomach pain Side effects that usually do not require medical attention (report to your doctor or health care professional if they continue or are bothersome):  drowsiness  dry mouth  headache  sore throat This list may not describe all possible side effects. Call your doctor for medical advice about side effects. You may report side effects to FDA at 1-800-FDA-1088. Where should I keep my medicine? Keep out of the reach of children. Store at room temperature between 20 and 25 degrees C (68 and 77 degrees F). Keep this medicine in the foil package until ready to use. Throw away any unused medicine after the expiration date. NOTE: This sheet is a summary. It may not cover all possible information. If you have questions about this medicine, talk to your doctor, pharmacist, or health care provider.  2021 Elsevier/Gold Standard (2018-01-22 16:14:46)      Oxycodone Capsules or Tablets What is this medicine? OXYCODONE (ox i KOE done) is a pain reliever, also called an opioid. It treats severe pain. This medicine may be used for other purposes; ask your health care provider or pharmacist if you have questions. COMMON BRAND NAME(S): Dazidox, Endocodone, Oxaydo,  OXECTA, OxyIR, Percolone, Roxicodone, Roxybond What should I tell my health care provider before I take this medicine? They need to know if you have any of these conditions:  brain tumor  drug abuse or addiction  head injury  heart disease  if you often drink alcohol  kidney disease  liver disease  low adrenal gland function  lung disease, asthma, or breathing problem  seizures  stomach or intestine problems  taken an MAOI such as Marplan, Nardil, or Parnate in the last 14 days  an unusual or allergic reaction to oxycodone, other drugs, foods, dyes, or preservatives  pregnant or trying to get pregnant  breast-feeding How should I use this medicine? Take this medicine by mouth with water. Take it as directed on the prescription label at the same time every day. You can take it with or without food. If it upsets your stomach, take it with food. Keep taking it unless your health care provider tells you to stop. Some brands of this medicine, like Oxaydo, have special instructions. Ask your doctor or pharmacist if these directions are for you: Do not cut, crush or chew this medicine. Do not wet, soak, or lick the tablet before you take it. A special MedGuide will be given to you by the pharmacist with each prescription and refill. Be sure to read this information carefully each time. Talk to your pediatrician regarding the use of this medicine in children. Special care may be needed. Overdosage: If you think you have taken too much of this medicine contact a poison control center or emergency room at once. NOTE: This medicine is only for you. Do not share this medicine with others. What if I miss a dose? If you miss a dose, take it as soon as  you can. If it is almost time for your next dose, take only that dose. Do not take double or extra doses. What may interact with this medicine? Do not take this medicine with any of the following medications:  safinamide This medicine  may interact with the following medications:  alcohol  antihistamines for allergy, cough, and cold  atropine  certain antivirals for HIV or hepatitis  certain antibiotics like clarithromycin, erythromycin, linezolid, rifampin  certain medicines for anxiety or sleep  certain medicines for bladder problems like oxybutynin, tolterodine  certain medicines for depression like amitriptyline, fluoxetine, sertraline  certain medicines for fungal infections like ketoconazole, itraconazole, posaconazole  certain medicines for migraine headache like almotriptan, eletriptan, frovatriptan, naratriptan, rizatriptan, sumatriptan, zolmitriptan  certain medicines for nausea or vomiting like dolasetron, granisetron, ondansetron, palonosetron  certain medicines for Parkinson's disease like benztropine, trihexyphenidyl  certain medicines for seizures like carbamazepine, phenobarbital, phenytoin, primidone  certain medicines for stomach problems like dicyclomine, hyoscyamine  certain medicines for travel sickness like scopolamine  diuretics  general anesthetics like halothane, isoflurane, methoxyflurane, propofol  ipratropium  MAOIs like Marplan, Nardil, and Parnate  medicines that relax muscles  methylene blue  other narcotic medicines for pain or cough  phenothiazines like chlorpromazine, mesoridazine, prochlorperazine, thioridazine This list may not describe all possible interactions. Give your health care provider a list of all the medicines, herbs, non-prescription drugs, or dietary supplements you use. Also tell them if you smoke, drink alcohol, or use illegal drugs. Some items may interact with your medicine. What should I watch for while using this medicine? Tell your health care provider if your pain does not go away, if it gets worse, or if you have new or a different type of pain. You may develop tolerance to this medicine. Tolerance means that you will need a higher dose of  the medicine for pain relief. Tolerance is normal and is expected if you take this medicine for a long time. Do not suddenly stop taking your medicine because you may develop a severe reaction. Your body becomes used to the medicine. This does NOT mean you are addicted. Addiction is a behavior related to getting and using a medicine for a nonmedical reason. If you have pain, you have a medical reason to take pain medicine. Your health care provider will tell you how much medicine to take. If your health care provider wants you to stop the medicine, the dose will be slowly lowered over time to avoid any side effects. If you take other medicines that also cause drowsiness such as other narcotic pain medicines, benzodiazepines, or other medicines for sleep, you may have more side effects. Give your health care provider a list of all medicines you use. He or she will tell you how much medicine to take. Do not take more medicine than directed. Get emergency help right away if you have trouble breathing or are unusually tired or sleepy. Talk to your health care provider about naloxone and how to get it. Naloxone is an emergency medicine used for an opioid overdose. An overdose can happen if you take too much opioid. It can also happen if an opioid is taken with some other medicines or substances, such as alcohol. Know the symptoms of an overdose, such as trouble breathing, unusually tired or sleepy, or not being able to respond or wake up. Make sure to tell caregivers and close contacts where it is stored. Make sure they know how to use it. After naloxone is given, you must  get emergency help right away. Naloxone is a temporary treatment. Repeat doses may be needed. You may get drowsy or dizzy. Do not drive, use machinery, or do anything that needs mental alertness until you know how this medicine affects you. Do not stand up or sit up quickly, especially if you are an older patient. This reduces the risk of dizzy or  fainting spells. Alcohol may interfere with the effect of this medicine. Avoid alcoholic drinks. This medicine will cause constipation. If you do not have a bowel movement for 3 days, call your health care provider. Your mouth may get dry. Chewing sugarless gum or sucking hard candy and drinking plenty of water may help. Contact your health care provider if the problem does not go away or is severe. What side effects may I notice from receiving this medicine? Side effects that you should report to your doctor or health care professional as soon as possible:  allergic reactions (skin rash, itching or hives; swelling of the face, lips, or tongue)  confusion  kidney injury (trouble passing urine or change in the amount of urine)  low adrenal gland function (nausea; vomiting; loss of appetite; unusually weak or tired; dizziness; low blood pressure)  low blood pressure (dizziness; feeling faint or lightheaded, falls; unusually weak or tired)  serotonin syndrome (irritable; confusion; diarrhea; fast or irregular heartbeat; muscle twitching; stiff muscles; trouble walking; sweating; high fever; seizures; chills; vomiting)  trouble breathing Side effects that usually do not require medical attention (report to your doctor or health care professional if they continue or are bothersome):  constipation  dry mouth  nausea, vomiting  tiredness This list may not describe all possible side effects. Call your doctor for medical advice about side effects. You may report side effects to FDA at 1-800-FDA-1088. Where should I keep my medicine? Keep out of the reach of children and pets. This medicine can be abused. Keep it in a safe place to protect it from theft. Do not share it with anyone. It is only for you. Selling or giving away this medicine is dangerous and against the law. Store at [employer] C (77 degrees F). Protect from light and moisture. Keep the container tightly closed. Get rid of any  unused medicine after the expiration date. This medicine may cause harm and death if it is taken by other adults, children, or pets. It is important to get rid of the medicine as soon as you no longer need it or it is expired. You can do this in two ways:  Take the medicine to a medicine take-back program. Check with your pharmacy or law enforcement to find a location.  If you cannot return the medicine, flush it down the toilet. NOTE: This sheet is a summary. It may not cover all possible information. If you have questions about this medicine, talk to your doctor, pharmacist, or health care provider.  2021 Elsevier/Gold Standard (2020-09-24 13:26:06)    General Anesthesia, Adult, Care After This sheet gives you information about how to care for yourself after your procedure. Your health care provider may also give you more specific instructions. If you have problems or questions, contact your health care provider. What can I expect after the procedure? After the procedure, the following side effects are common:  Pain or discomfort at the IV site.  Nausea.  Vomiting.  Sore throat.  Trouble concentrating.  Feeling cold or chills.  Feeling weak or tired.  Sleepiness and fatigue.  Soreness and body aches. These  side effects can affect parts of the body that were not involved in surgery. Follow these instructions at home: For the time period you were told by your health care provider:  Rest.  Do not participate in activities where you could fall or become injured.  Do not drive or use machinery.  Do not drink alcohol.  Do not take sleeping pills or medicines that cause drowsiness.  Do not make important decisions or sign legal documents.  Do not take care of children on your own.   Eating and drinking  Follow any instructions from your health care provider about eating or drinking restrictions.  When you feel hungry, start by eating small amounts of foods that are  soft and easy to digest (bland), such as toast. Gradually return to your regular diet.  Drink enough fluid to keep your urine pale yellow.  If you vomit, rehydrate by drinking water, juice, or clear broth. General instructions  If you have sleep apnea, surgery and certain medicines can increase your risk for breathing problems. Follow instructions from your health care provider about wearing your sleep device: ? Anytime you are sleeping, including during daytime naps. ? While taking prescription pain medicines, sleeping medicines, or medicines that make you drowsy.  Have a responsible adult stay with you for the time you are told. It is important to have someone help care for you until you are awake and alert.  Return to your normal activities as told by your health care provider. Ask your health care provider what activities are safe for you.  Take over-the-counter and prescription medicines only as told by your health care provider.  If you smoke, do not smoke without supervision.  Keep all follow-up visits as told by your health care provider. This is important. Contact a health care provider if:  You have nausea or vomiting that does not get better with medicine.  You cannot eat or drink without vomiting.  You have pain that does not get better with medicine.  You are unable to pass urine.  You develop a skin rash.  You have a fever.  You have redness around your IV site that gets worse. Get help right away if:  You have difficulty breathing.  You have chest pain.  You have blood in your urine or stool, or you vomit blood. Summary  After the procedure, it is common to have a sore throat or nausea. It is also common to feel tired.  Have a responsible adult stay with you for the time you are told. It is important to have someone help care for you until you are awake and alert.  When you feel hungry, start by eating small amounts of foods that are soft and easy to  digest (bland), such as toast. Gradually return to your regular diet.  Drink enough fluid to keep your urine pale yellow.  Return to your normal activities as told by your health care provider. Ask your health care provider what activities are safe for you. This information is not intended to replace advice given to you by your health care provider. Make sure you discuss any questions you have with your health care provider. Document Revised: 07/19/2020 Document Reviewed: 02/16/2020 Elsevier Patient Education  2021 [employer]

## 2021-04-03 NOTE — Interval H&P Note (Signed)
History and Physical Interval Note:  04/03/2021 11:30 AM  Krista Wang  has presented today for surgery, with the diagnosis of Cholelithiasis.  The various methods of treatment have been discussed with the patient and family. After consideration of risks, benefits and other options for treatment, the patient has consented to  Procedure(s): LAPAROSCOPIC CHOLECYSTECTOMY (N/A) as a surgical intervention.  The patient's history has been reviewed, patient examined, no change in status, stable for surgery.  I have reviewed the patient's chart and labs.  Questions were answered to the patient's satisfaction.     Lucretia Roers

## 2021-04-04 ENCOUNTER — Encounter (HOSPITAL_COMMUNITY): Payer: Self-pay | Admitting: General Surgery

## 2021-04-04 LAB — SURGICAL PATHOLOGY

## 2021-04-04 NOTE — Anesthesia Postprocedure Evaluation (Signed)
Anesthesia Post Note  Patient: Krista Wang  Procedure(s) Performed: LAPAROSCOPIC CHOLECYSTECTOMY (N/A Abdomen)  Patient location during evaluation: Phase II Anesthesia Type: General Level of consciousness: awake Pain management: pain level controlled Vital Signs Assessment: post-procedure vital signs reviewed and stable Respiratory status: spontaneous breathing and respiratory function stable Cardiovascular status: blood pressure returned to baseline and stable Postop Assessment: no headache and no apparent nausea or vomiting Anesthetic complications: no Comments: Late entry   No complications documented.   Last Vitals:  Vitals:   04/03/21 1430 04/03/21 1451  BP: 110/68 118/84  Pulse: 76 75  Resp: 14 16  Temp:  36.7 C  SpO2: 97% 100%    Last Pain:  Vitals:   04/03/21 1451  TempSrc: Oral  PainSc: 4                  Windell Norfolk

## 2021-04-11 ENCOUNTER — Ambulatory Visit: Payer: No Typology Code available for payment source | Admitting: Pediatrics

## 2021-04-18 ENCOUNTER — Ambulatory Visit (INDEPENDENT_AMBULATORY_CARE_PROVIDER_SITE_OTHER): Payer: 59 | Admitting: General Surgery

## 2021-04-18 DIAGNOSIS — K8020 Calculus of gallbladder without cholecystitis without obstruction: Secondary | ICD-10-CM

## 2021-04-18 NOTE — Progress Notes (Signed)
[facility]  I am calling the patient for post operative evaluation. This is not a billable encounter as it is under the global charges for the surgery.  The patient had a laparoscopic cholecystectomy on 5/18. The patient reports that she is doing well. The are tolerating a diet, having good pain control, and having regular Bms.  The incisions are healing but she did have a sutures that is spitting. Told her she can trim it at the skin or come by the office and I can trim it if she wants. The patient has no concerns.   Pathology: FINAL MICROSCOPIC DIAGNOSIS:   A. GALLBLADDER, CHOLECYSTECTOMY:  - Chronic cholecystitis.  - Cholelithiasis.   Will see the patient PRN.   Algis Greenhouse, MD [facility] [address] Vella Raring [place], [place] [address] [phone] (office)

## 2021-05-27 ENCOUNTER — Encounter: Payer: Self-pay | Admitting: Pediatrics

## 2021-06-06 ENCOUNTER — Encounter (INDEPENDENT_AMBULATORY_CARE_PROVIDER_SITE_OTHER): Payer: Self-pay | Admitting: Family

## 2021-06-06 ENCOUNTER — Other Ambulatory Visit: Payer: Self-pay

## 2021-06-06 ENCOUNTER — Encounter (INDEPENDENT_AMBULATORY_CARE_PROVIDER_SITE_OTHER): Payer: Self-pay

## 2021-06-06 ENCOUNTER — Ambulatory Visit (INDEPENDENT_AMBULATORY_CARE_PROVIDER_SITE_OTHER): Payer: 59 | Admitting: Family

## 2021-06-06 VITALS — BP 120/78 | HR 76 | Wt 228.0 lb

## 2021-06-06 DIAGNOSIS — I10 Essential (primary) hypertension: Secondary | ICD-10-CM | POA: Diagnosis not present

## 2021-06-06 DIAGNOSIS — L83 Acanthosis nigricans: Secondary | ICD-10-CM

## 2021-06-06 DIAGNOSIS — Z68.41 Body mass index (BMI) pediatric, greater than or equal to 95th percentile for age: Secondary | ICD-10-CM

## 2021-06-06 DIAGNOSIS — E1065 Type 1 diabetes mellitus with hyperglycemia: Secondary | ICD-10-CM

## 2021-06-06 DIAGNOSIS — Z794 Long term (current) use of insulin: Secondary | ICD-10-CM

## 2021-06-06 DIAGNOSIS — E669 Obesity, unspecified: Secondary | ICD-10-CM

## 2021-06-06 LAB — POCT GLYCOSYLATED HEMOGLOBIN (HGB A1C): Hemoglobin A1C: 7.1 % — AB (ref 4.0–5.6)

## 2021-06-06 LAB — POCT GLUCOSE (DEVICE FOR HOME USE): Glucose Fasting, POC: 167 mg/dL — AB (ref 70–99)

## 2021-06-06 NOTE — Progress Notes (Signed)
Pediatric Endocrinology Diabetes Consultation Follow-up Visit  Krista Wang [birth year] [id number]  Chief Complaint: Follow-up type 1 diabetes   Rosiland Oz, MD   HPI: Krista Wang  is a 20 y.o. female presenting for follow-up of type 1 diabetes. she is accompanied to this visit by her mother and father.  1. Krista Wang noted about two years ago that her urine was thick like juice. She saw her PCP and had elevated urine glucose. She went to [facility] where her HbA1c was 11%. She was diagnosed with T2DM, started on metformin, 500 mg, twice daily, and followed at [facility] until about 9 months ago. During that time her weight had continued to increase to apeak weight of 223 pounds in march 2017. Her HbA1c had decreased to about 6% as of her last visit.  Her weight has decreased from 217 pounds in May 2017 to 189 now. She has been trying to lose weight.                          About two weeks ago Krista Wang became aware that she was urinating more, had nocturia, and was drinking more. She also developed nausea, upset stomach severe enough to cause her not to want to eat, and abdominal pains. Her vision has been normal. She has lost 31 pounds since May. She presented to clinic with a A1c of >14 and urine ketones. She was admitted to the [facility] and started on two component method using Lantus and Novolog.   She had elevated insulin antibody with normal GAD and negative pancreatic islet cell antibody.   2. Since discharge from hospital on 02/2021, Krista Wang has been doing well. No Hospitalizations.   She has been busy working at [employer]. 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   She reports that diabetes is going "ok". She is wearing Dexcom CGM which is working well for her. Recently had a sinus infections which made her run higher for a week or so. She estimates she is taking around 8 units of Novolog per meal.  She does not miss injections. Concerned that she is not counting carbs accurately, usually estimates. Hypoglycemia is rare.   She is no long being followed by nephrology.    Insulin regimen: 34 units of Lantus. Novolog 120/30/6 plan  Hypoglycemia: Able to feel low blood sugars.  No glucagon needed recently.  Blood glucose download:  Dexcom CGM:     Med-alert ID: Not currently wearing. Injection sites: Arms, legs and abdomen  Annual labs due: 05/2021 Ophthalmology due: 2021. Discussed with family today. She made an appointment now that she has eye insurance again.     3. ROS: Greater than 10 systems reviewed with pertinent positives listed in HPI, otherwise neg. Review of Systems  Constitutional: 4 lbs weight gain. Sleeping well.  HENT: Negative.   Eyes: Negative for blurred vision and pain.  Respiratory: Negative for cough and shortness of breath.   Cardiovascular: Negative for chest pain and palpitations.  Gastrointestinal: Negative for abdominal pain, constipation, diarrhea and heartburn.  Genitourinary: Negative for frequency and urgency.  Musculoskeletal: Negative for neck pain.  Skin: Negative for itching and rash.  Neurological: Negative for dizziness, tremors, sensory change and weakness.  Endo/Heme/Allergies: Negative for polydipsia.  Psychiatric/Behavioral: Negative for depression. The patient is not nervous/anxious.      Past Medical History:   Past Medical History:  Diagnosis Date  Allergy    Asthma    Diabetes mellitus without complication (HCC)    type 1   Gallstones    Hypertension    Evaluated by [facility] in 2019, did not follow up or have an appt in July 2019 per Nephrology's recs   Impacted ear wax 12/21/2015   Irregular menses    Nocturnal enuresis 07/01/2013   Obesity    Unspecified constipation 07/07/2013   Weight gain 07/07/2013    Medications:  Outpatient Encounter Medications as of 06/06/2021  Medication Sig Note   Accu-Chek FastClix  Lancets MISC USE 1 LANCET TO CHECK GLUCOSE SIX TIMES DAILY    ACCU-CHEK GUIDE test strip USE TO CHECK GLUCOSE 6 TIMES DAILY    acetone, urine, test strip Check ketones per protocol    albuterol (PROVENTIL HFA;VENTOLIN HFA) 108 (90 Base) MCG/ACT inhaler 2 puffs before exercise or every 4 to 6 hours as needed for cough or wheezing (Patient taking differently: Inhale 2 puffs into the lungs as directed. 2 puffs before exercise or every 4 to 6 hours as needed for cough or wheezing)    BAQSIMI ONE PACK 3 MG/DOSE POWD PLACE 1 DOSE INTO THE NOSE AS NEEDED    BD PEN NEEDLE NANO 2ND GEN 32G X 4 MM MISC USE 1 PEN NEEDLE SIX TIMES DAILY TO INJECT INSULIN    Continuous Blood Gluc Sensor (DEXCOM G6 SENSOR) MISC USE AS DIRECTED CHANGE  SENSOR  EVERY  10  DAYS    Continuous Blood Gluc Transmit (DEXCOM G6 TRANSMITTER) MISC USE AS DIRECTED EVERY 3 MONTHS    glucagon 1 MG injection Use for Severe Hypoglycemia . Inject 1 mg intramuscularly if unresponsive, unable to swallow, unconscious and/or has seizure (Patient taking differently: Inject 1 mg into the muscle as needed (severe hypoglycemia). Use for Severe Hypoglycemia . Inject 1 mg intramuscularly if unresponsive, unable to swallow, unconscious and/or has seizure)    insulin glargine (LANTUS SOLOSTAR) 100 UNIT/ML Solostar Pen INJECT UP TO 50 UNITS SUBCUTANEOUSLY ONCE DAILY (Patient taking differently: Inject 34 Units into the skin at bedtime.)    NOVOLOG FLEXPEN 100 UNIT/ML FlexPen INJECT UP TO 50 UNITS SUBCUTANEOUSLY ONCE DAILY AS DIRECTED (Patient taking differently: Inject 0-7 Units into the skin with breakfast, with lunch, and with evening meal. Sliding Scale Insulin)    ondansetron (ZOFRAN) 4 MG tablet Take 1 tablet (4 mg total) by mouth every 8 (eight) hours as needed.    norgestimate-ethinyl estradiol (SPRINTEC 28) 0.25-35 MG-MCG tablet Take 1 tablet by mouth daily. Patient overdue for yearly check up, needs to schedule (Patient not taking: Reported on 06/06/2021)  04/02/2021: Last dose 03/26/2021   oxyCODONE (ROXICODONE) 5 MG immediate release tablet Take 1 tablet (5 mg total) by mouth every 4 (four) hours as needed for severe pain or breakthrough pain. (Patient not taking: Reported on 06/06/2021)    No facility-administered encounter medications on file as of 06/06/2021.    Allergies: Allergies  Allergen Reactions   Blue Dyes (Parenteral) Swelling    Surgical History: Past Surgical History:  Procedure Laterality Date   CHOLECYSTECTOMY N/A 04/03/2021   Procedure: LAPAROSCOPIC CHOLECYSTECTOMY;  Surgeon: Lucretia Roers, MD;  Location: [facility];  Service: General;  Laterality: N/A;   TONSILLECTOMY     WISDOM TOOTH EXTRACTION      Family History:  Family History  Problem Relation Age of Onset   Diabetes Mother    Hypertension Mother    Diabetes Father    Hypertension Father    Hypertension  Maternal Grandmother    Stroke Maternal Grandmother    Diabetes Maternal Grandmother    Thyroid disease Maternal Grandmother    Heart attack Maternal Grandfather    Diabetes Paternal Grandmother    Hypertension Paternal Grandmother    Dementia Paternal Grandmother    Kidney failure Paternal Grandfather    Hypertension Paternal Grandfather    Diabetes Paternal Grandfather    Thyroid disease Maternal Aunt       Social History: Lives with: mother and father  Sophomore at [employer]   Physical Exam:  Vitals:   06/06/21 1059  BP: 120/78  Pulse: 76  Weight: 267 lb (121.1 kg)    BP 120/78 (BP Location: Right Arm, Patient Position: Sitting, Cuff Size: Normal)   Pulse 76   Wt 267 lb (121.1 kg)   BMI 43.09 kg/m  Body mass index: body mass index is 43.09 kg/m. Blood pressure percentiles are not available for patients who are 18 years or older.  Ht Readings from Last 3 Encounters:  04/02/21 5\' 6"  (1.676 m) (75 %, Z= 0.67)*  03/26/21 5\' 6"  (1.676 m) (75 %, Z= 0.67)*  03/21/21 5\' 6"  (1.676 m) (75 %, Z= 0.67)*   * Growth percentiles are based on CDC  (Girls, 2-20 Years) data.   Wt Readings from Last 3 Encounters:  06/06/21 267 lb (121.1 kg) (>99 %, Z= 2.63)*  04/02/21 224 lb (101.6 kg) (99 %, Z= 2.25)*  03/26/21 224 lb (101.6 kg) (99 %, Z= 2.25)*   * Growth percentiles are based on CDC (Girls, 2-20 Years) data.    Physical Exam  General: Well developed, well nourished female in no acute distress.   Head: Normocephalic, atraumatic.   Eyes:  Pupils equal and round. EOMI.   Sclera white.  No eye drainage.   Ears/Nose/Mouth/Throat: Nares patent, no nasal drainage.  Normal dentition, mucous membranes moist.   Neck: supple, no cervical lymphadenopathy, no thyromegaly Cardiovascular: regular rate, normal S1/S2, no murmurs Respiratory: No increased work of breathing.  Lungs clear to auscultation bilaterally.  No wheezes. Abdomen: soft, nontender, nondistended. Normal bowel sounds.  No appreciable masses  Extremities: warm, well perfused, cap refill < 2 sec.   Musculoskeletal: Normal muscle mass.  Normal strength Skin: warm, dry.  No rash or lesions. + acanthosis nigricans  Neurologic: alert and oriented, normal speech, no tremor    Labs:  Last Hemoglobin A1c: 6.6% on 02/2021  Results for orders placed or performed in visit on 06/06/21  POCT glycosylated hemoglobin (Hb A1C)  Result Value Ref Range   Hemoglobin A1C 7.1 (A) 4.0 - 5.6 %   HbA1c POC (<> result, manual entry)     HbA1c, POC (prediabetic range)     HbA1c, POC (controlled diabetic range)    POCT Glucose (Device for Home Use)  Result Value Ref Range   Glucose Fasting, POC 167 (A) 70 - 99 mg/dL   POC Glucose      Assessment/Plan: Krista Wang is a 20 y.o. female with type 1 diabetes on MDI and Dexcom CGM. She is having a pattern of hyperglycemia overnight, needs stronger lantus dose. Hemoglobin A1c is 7.1% today.     1. DM w/o complication type I, uncontrolled (HCC)/hypergylcemia/elevated a1c  - Increase lantus to 36 units  -  Novolog 120/30/6 plan.   - Reviewed CGM  download. Discussed trends and patterns.  - Rotate injectionsites to prevent scar tissue.  - bolus 15 minutes prior to eating to limit blood sugar spikes.  - Reviewed carb counting and  importance of accurate carb counting.  - Discussed signs and symptoms of hypoglycemia. Always have glucose available.  - POCT glucose and hemoglobin A1c  - Reviewed growth chart.  - Discussed insulin pump therapy but she prefers injections at this time.   2. Acanthosis nigricans - Stable and consistent with insulin resistance.   3. Obesity - Encouraged daily exercise at least 30 minutes per day. Discussed diet and made suggestions for improvements.   4. Hypertension.  - Continue follow up with Nephrology  - Normal today.     Follow-up:   3 month. Mychart message with blood sugars as needed for adjustments.   >45 spent today reviewing the medical chart, counseling the patient/family, and documenting today's visit.   Gretchen Short,  FNP-C  Pediatric Specialist  [address]  [place], [address]  Tele: [phone]

## 2021-06-06 NOTE — Progress Notes (Deleted)
Pediatric Specialists [facility] [address], [address], [place], [place] [address] Phone: [phone] Fax: [phone]                                         Diabetes Medical Management Plan                                                  School Year [id number] *This diabetes plan serves as a healthcare provider order, transcribe onto school form.   The nurse will teach school staff procedures as needed for diabetic care in the school.Krista Wang   DOB: 2001-03-05   School: _______________________________________________________________  Parent/Guardian: ___________________________phone #: _____________________  Parent/Guardian: ___________________________phone #: _____________________  Diabetes Diagnosis: {CHL AMB PED DIABETES DIAGNOSES:[phone]}  ______________________________________________________________________  Blood Glucose Monitoring   Target range for blood glucose is: {CHL AMB PED DIABETES TARGET RANGE:4028292109} mg/dL  Times to check blood glucose level: {CHL AMB PED DIABETES TIMES TO CHECK BLOOD [id number]  Student has a CGM (Continuous Glucose Monitor): {CHL AMB PED DIABETES STUDENT HAS GYI:9485462703} Student {Actions; may/not:14603} use blood sugar reading from continuous glucose monitor to determine insulin dose.   CGM Alarms. If CGM alarm goes off and student is unsure of how to respond to alarm, student should be escorted to school nurse/school diabetes team member. If CGM is not working or if student is not wearing it, check blood sugar via fingerstick. If CGM is dislodged, do NOT throw it away, and return it to parent/guardian. CGM site may be reinforced with medical tape. If glucose is low on CGM 15 minutes after hypoglycemia treatment, check glucose with fingerstick and glucometer.  Student's Self Care for Glucose Monitoring: {CHL AMB PED DIABETES STUDENTS SELF-CARE:[phone]} Self treats mild hypoglycemia:  {YES/NO:21197} It is preferable to treat hypoglycemia in the classroom so student does not miss instructional time.  If the student is not in the classroom (ie at recess or specials, etc) and does not have fast sugar with them, then they should be escorted to the school nurse/school diabetes team member. If the student has a CGM and uses a cell phone as the reader device, the cell phone should be with them at all times.    Hypoglycemia (Low Blood Sugar) Hyperglycemia (High Blood Sugar)   Shaky                           Dizzy Sweaty                         Weakness/Fatigue Pale                              Headache Fast Heart Beat            Blurry vision Hungry                         Slurred Speech Irritable/Anxious           Seizure  Complaining of feeling low or CGM alarms low  Frequent urination          Abdominal Pain Increased Thirst  Headaches           Nausea/Vomiting            Fruity Breath Sleepy/Confused            Chest Pain Inability to Concentrate Irritable Blurred Vision   Check glucose if signs/symptoms above Stay with child at all times Give 15 grams of carbohydrate (fast sugar) if blood sugar is less than 80 mg/dL, and child is conscious, cooperative, and able to swallow.  3-4 glucose tabs Half cup (4 oz) of juice or regular soda Check blood sugar in 15 minutes. If blood sugar does not improve, give fast sugar again If still no improvement after 2 fast sugars, call provider and parent/guardian. Call 911, parent/guardian and/or child's health care provider if Child's symptoms do not go away Child loses consciousness Unable to reach parent/guardian and symptoms worsen  If child is UNCONSCIOUS, experiencing a seizure or unable to swallow Place student on side Give Glucagon: {CHL AMB PED DIABETES GLUCAGON DOSE:[phone]} CALL 911, parent/guardian, and/or child's health care provider  *Pump- Review pump therapy guidelines Check glucose if  signs/symptoms above Check Ketones if above 350 mg/dL after 2 glucose checks if ketone strips are available. Notify Parent/Guardian if glucose is over 350 mg/dL and patient has ketones in urine. Encourage water/sugar free to drink, allow unlimited use of bathroom Administer insulin as below if it has been over 3 hours since last insulin dose Recheck glucose in 2.5-3 hours CALL 911 if child Loses consciousness Unable to reach parent/guardian and symptoms worsen       8.   If moderate to large ketones or no ketone strips available to check urine ketones, contact parent.  *Pump Check pump function Check pump site Check tubing Treat for hyperglycemia as above Refer to Pump Therapy Orders              Do not allow student to walk anywhere alone when blood sugar is low or suspected to be low.  Follow this protocol even if immediately prior to a meal.    Insulin Therapy  Fixed dose: ***  Adjustable Insulin, 2 Component Method:  See actual method below.  Two Component Method ***  When to give insulin Breakfast: {CHL AMB PED DIABETES MEAL COVERAGE:[phone]} Lunch: {CHL AMB PED DIABETES MEAL COVERAGE:[phone]} Snack: {CHL AMB PED DIABETES MEAL COVERAGE:[phone]}  Student's Self Care Insulin Administration Skills: {CHL AMB PED DIABETES STUDENTS SELF-CARE:9302564798}  If there is a change in the daily schedule (field trip, delayed opening, early release or class party), please contact parents for instructions.  Parents/Guardians Authorization to Adjust Insulin Dose: {YES/NO TITLE CASE:22902}:  Parents/guardians are authorized to increase or decrease insulin doses plus or minus 3 units.   Pump Therapy   Basal rates per pump.  For blood glucose greater than  300 mg/dL that has not decreased within 2.5-3 hours after correction, consider pump failure or infusion site failure.  For any pump/site failure: Notify parent/guardian. If you cannot get in touch with parent/guardian then  please contact patient's endocrinology provider at [phone].  Give correction by pen or vial/syringe.  If pump on, pump can be used to calculate insulin dose, but give insulin by pen or vial/syringe. If any concerns at any time regarding pump, please contact parents Other: ***   Student's Self Care Pump Skills: {CHL AMB PED DIABETES STUDENTS SELF-CARE:9302564798}  Insert infusion site Set temporary basal rate/suspend pump Bolus for carbohydrates and/or correction Change batteries/charge device, trouble shoot alarms, address any malfunctions  Physical Activity, Exercise and Sports  A quick acting source of carbohydrate such as glucose tabs or juice must be available at the site of physical education activities or sports. Krista Wang is encouraged to participate in all exercise, sports and activities.  Do not withhold exercise for high blood glucose.   Krista Wang may participate in sports, exercise if blood glucose is above {CHL AMB PED DIABETES BLOOD GLUCOSE:6362048130}.  For blood glucose below {CHL AMB PED DIABETES BLOOD GLUCOSE:6362048130} before exercise, give {CHL AMB PED DIABETES GRAMS CARBOHYDRATES:[phone]} grams carbohydrate snack without insulin.   Testing  ALL STUDENTS SHOULD HAVE A 504 PLAN or IHP (See 504/IHP for additional instructions).  The student may need to step out of the testing environment to take care of personal health needs (example:  treating low blood sugar or taking insulin to correct high blood sugar).   The student should be allowed to return to complete the remaining test pages, without a time penalty.   The student must have access to glucose tablets/fast acting carbohydrates/juice at all times. The student will need to be within 20 feet of their CGM reader/phone, and insulin pump reader/phone.   SPECIAL INSTRUCTIONS: ***  I give permission to the school nurse, trained diabetes personnel, and other designated staff members of  _________________________school to perform and carry out the diabetes care tasks as outlined by Krista Wang Diabetes Medical Management Plan.  I also consent to the release of the information contained in this Diabetes Medical Management Plan to all staff members and other adults who have custodial care of Krista Wang and who may need to know this information to maintain [employer] health and safety.       Physician Signature: Osa Craver, CMA               Date: 06/06/2021 Parent/Guardian Signature: _______________________  Date: ___________________

## 2021-06-17 ENCOUNTER — Other Ambulatory Visit (INDEPENDENT_AMBULATORY_CARE_PROVIDER_SITE_OTHER): Payer: Self-pay | Admitting: Family

## 2021-06-22 LAB — LIPID PANEL
Cholesterol: 183 mg/dL — ABNORMAL HIGH (ref <170)
HDL: 60 mg/dL (ref >45)
LDL Cholesterol (Calc): 107 mg/dL (calc) (ref <110)
Non-HDL Cholesterol (Calc): 123 mg/dL (calc) — ABNORMAL HIGH (ref <120)
Total CHOL/HDL Ratio: 3.1 (calc) (ref <5.0)
Triglycerides: 73 mg/dL (ref <90)

## 2021-06-22 LAB — TSH: TSH: 1.42 mIU/L

## 2021-06-22 LAB — MICROALBUMIN / CREATININE URINE RATIO
Creatinine, Urine: 58 mg/dL (ref 20–275)
Microalb Creat Ratio: 3 mcg/mg creat (ref <30)
Microalb, Ur: 0.2 mg/dL

## 2021-06-22 LAB — T4, FREE: Free T4: 1.1 ng/dL (ref 0.8–1.4)

## 2021-07-21 ENCOUNTER — Other Ambulatory Visit (INDEPENDENT_AMBULATORY_CARE_PROVIDER_SITE_OTHER): Payer: Self-pay | Admitting: Family

## 2021-08-01 IMAGING — US US ABDOMEN LIMITED RUQ/ASCITES
1 series · 14 of 25 positions shown · non-contrast
Comparison: None.

CLINICAL DATA: 20-year-old female with right upper quadrant
abdominal pain.

EXAM:
ULTRASOUND ABDOMEN LIMITED RIGHT UPPER QUADRANT

[Series 1: us abdomen limited ruq (liver/gb) · 14 of 81 slices shown]
[im 1/81]
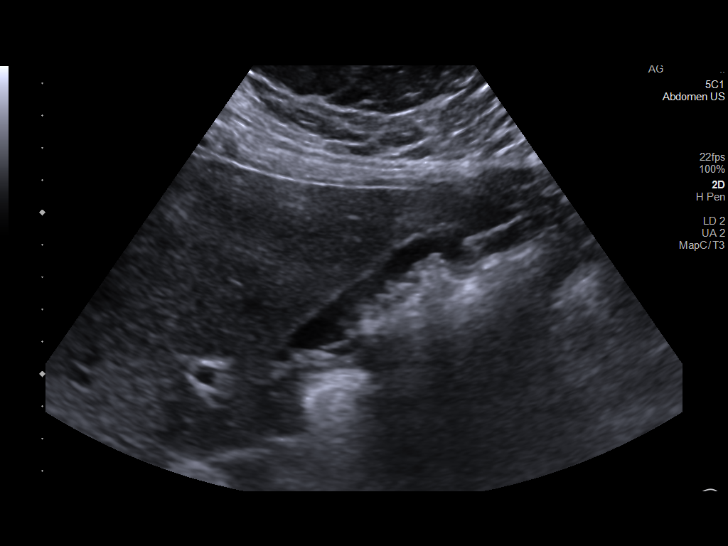
[im 7/81]
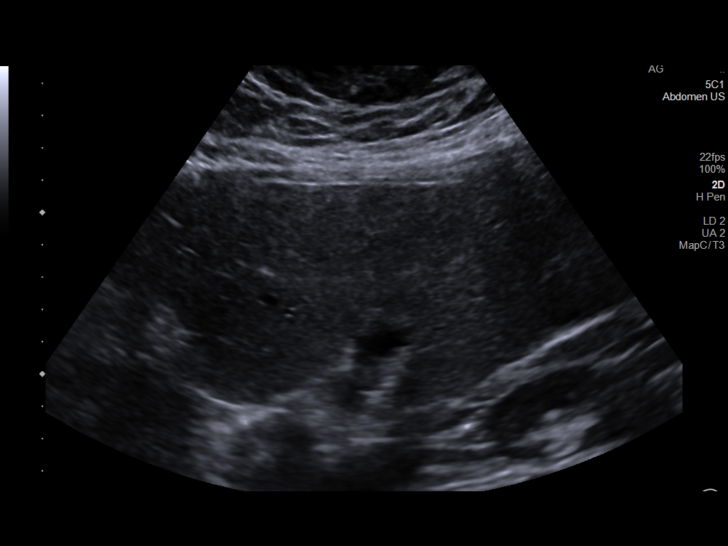
[im 14/81]
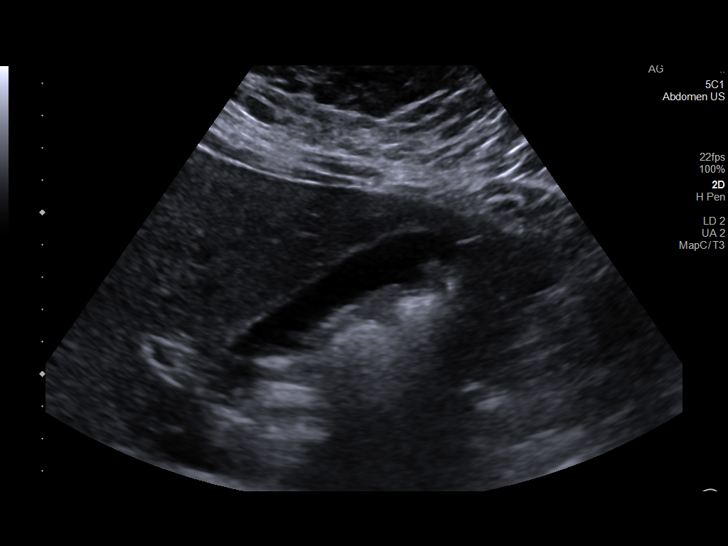
[im 21/81]
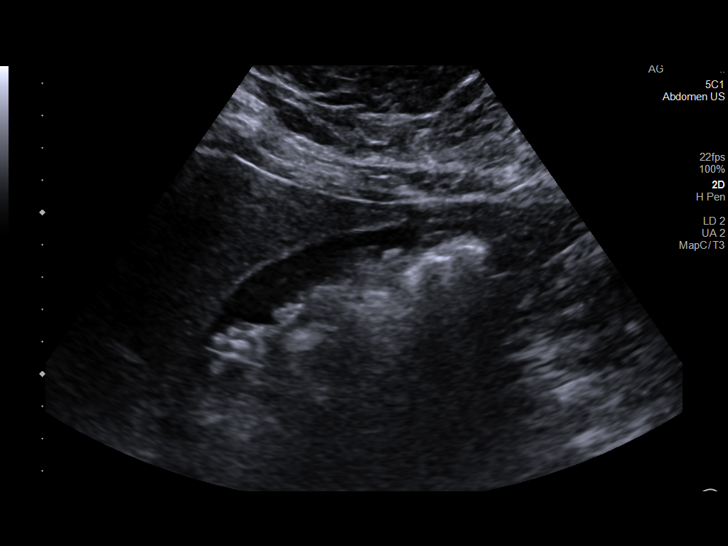
[im 27/81]
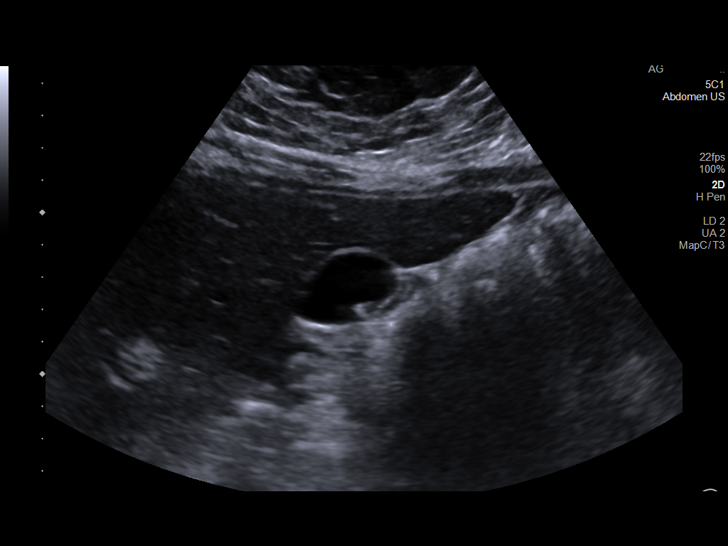
[im 31/81]
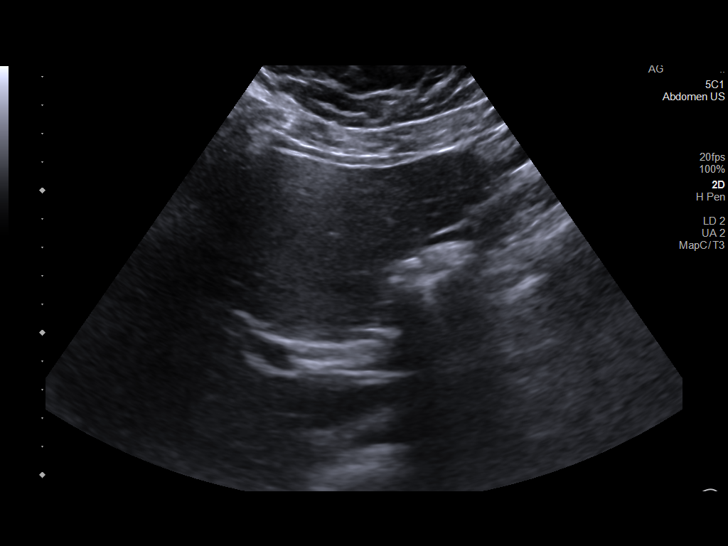
[im 37/81]
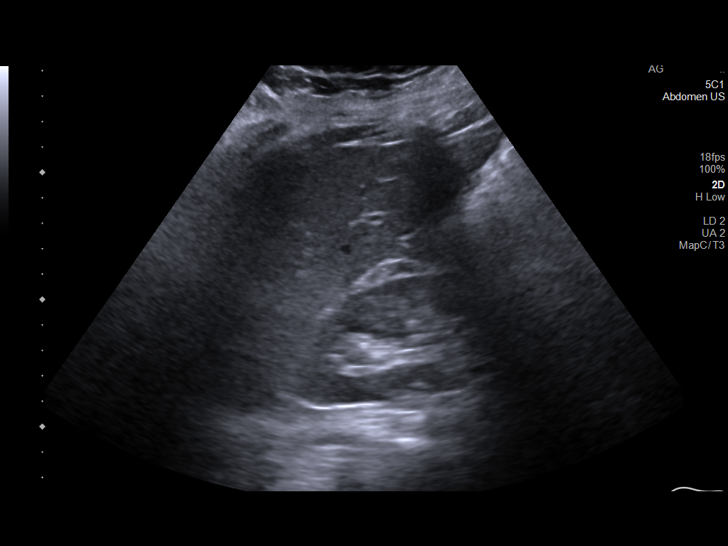
[im 44/81]
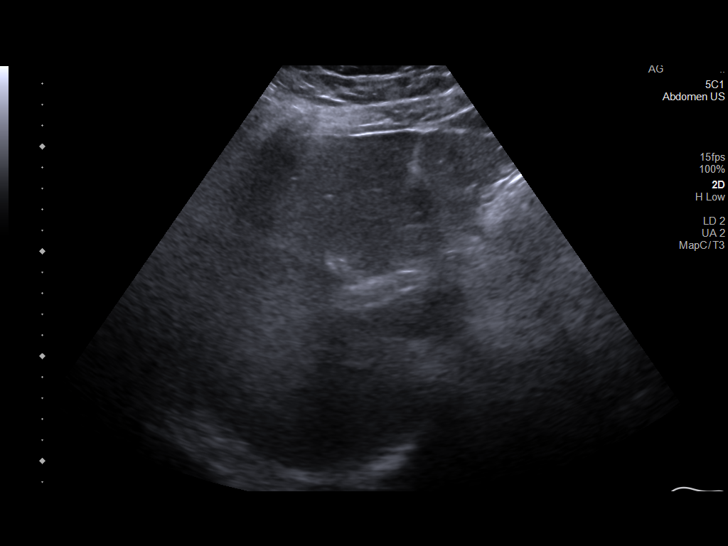
[im 51/81]
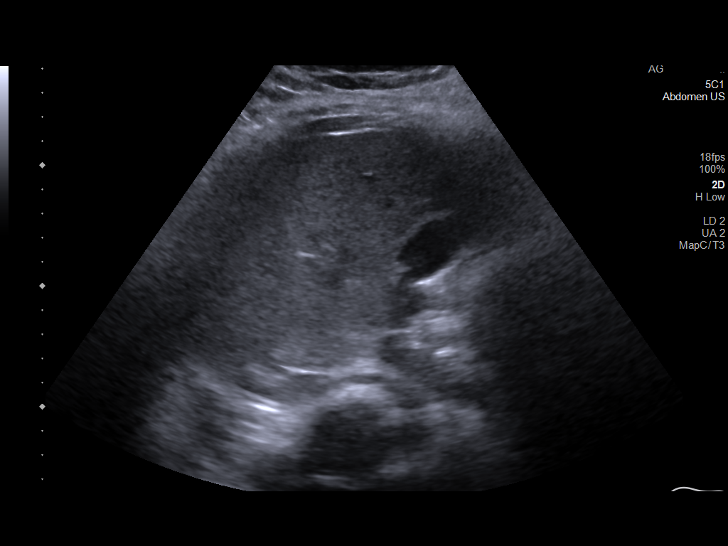
[im 54/81]
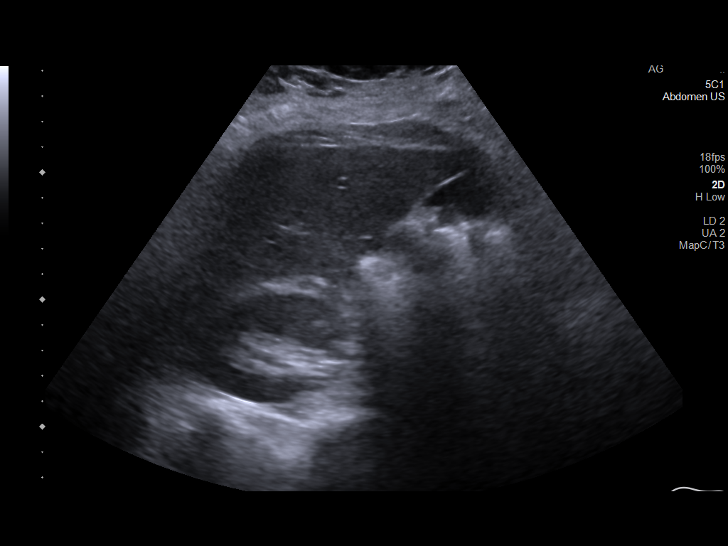
[im 61/81]
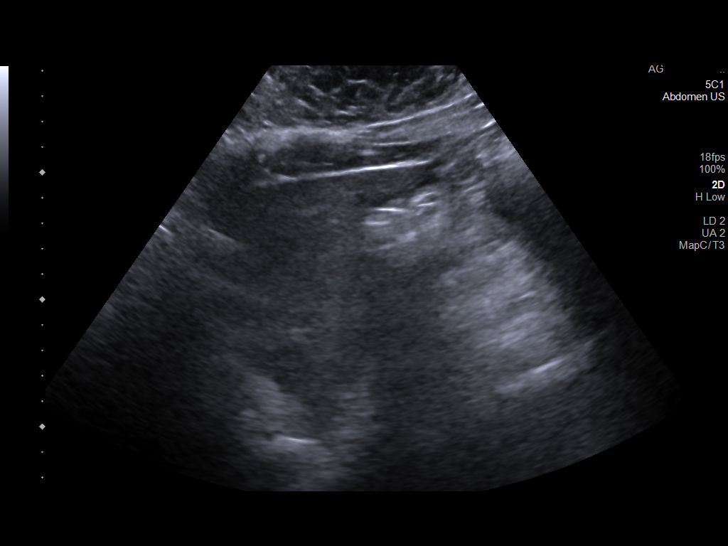
[im 67/81]
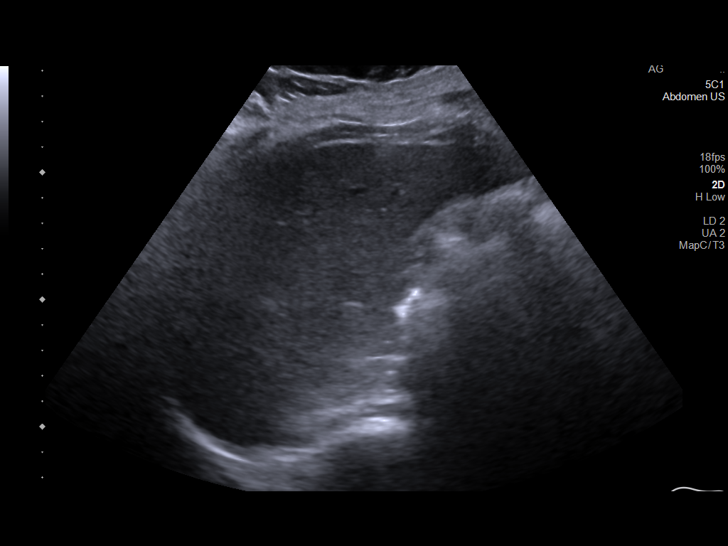
[im 74/81]
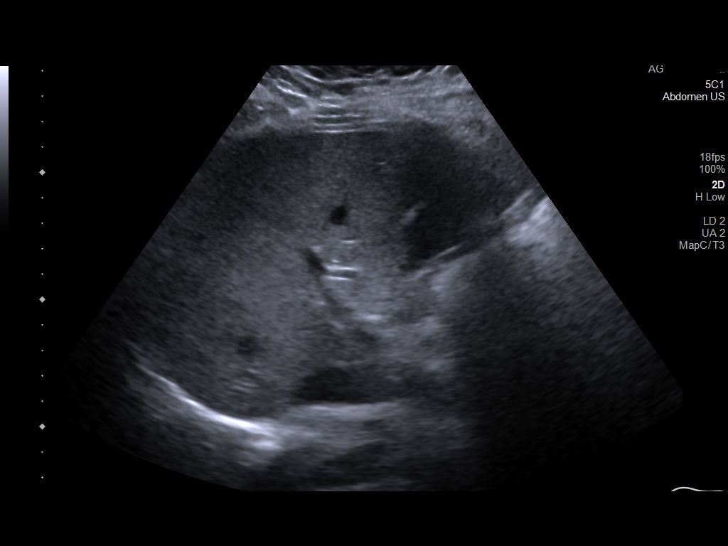
[im 81/81]
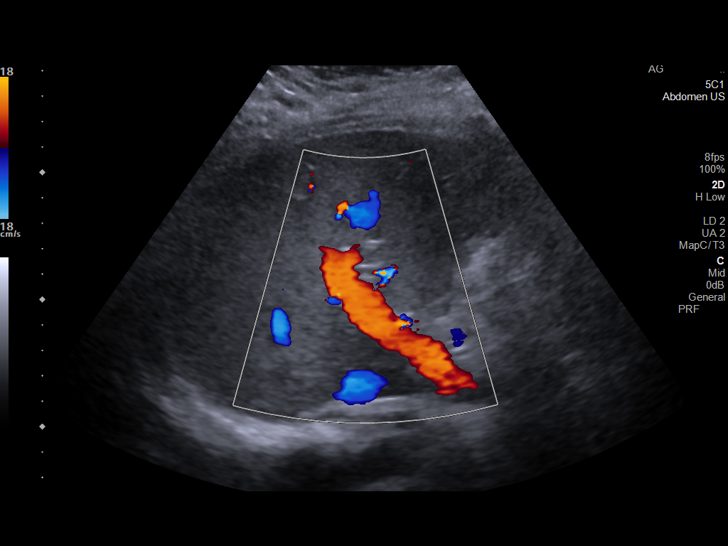

[14 of 25 positions shown; findings below may reference images not displayed]

FINDINGS: Gallbladder:

There multiple stones within the gallbladder. No gallbladder wall
thickening or pericholecystic fluid. Negative sonographic Murphy
sign.

Common bile duct:

Diameter: 3 mm

Liver:

There is diffuse increased liver echogenicity most commonly seen in
the setting of fatty infiltration. Superimposed inflammation or
fibrosis is not excluded. Clinical correlation is recommended.
Portal vein is patent on color Doppler imaging with normal direction
of blood flow towards the liver.

Other: None.
IMPRESSION: 1. Cholelithiasis without sonographic evidence of acute
cholecystitis.
2. Fatty liver.

## 2021-08-09 ENCOUNTER — Encounter (INDEPENDENT_AMBULATORY_CARE_PROVIDER_SITE_OTHER): Payer: Self-pay

## 2021-08-30 ENCOUNTER — Other Ambulatory Visit (INDEPENDENT_AMBULATORY_CARE_PROVIDER_SITE_OTHER): Payer: Self-pay | Admitting: Family

## 2021-09-06 ENCOUNTER — Ambulatory Visit (INDEPENDENT_AMBULATORY_CARE_PROVIDER_SITE_OTHER): Payer: 59 | Admitting: Family

## 2021-10-29 ENCOUNTER — Ambulatory Visit (INDEPENDENT_AMBULATORY_CARE_PROVIDER_SITE_OTHER): Payer: 59 | Admitting: Family

## 2021-10-29 ENCOUNTER — Other Ambulatory Visit (INDEPENDENT_AMBULATORY_CARE_PROVIDER_SITE_OTHER): Payer: Self-pay

## 2021-10-29 ENCOUNTER — Encounter (INDEPENDENT_AMBULATORY_CARE_PROVIDER_SITE_OTHER): Payer: Self-pay | Admitting: Family

## 2021-10-29 ENCOUNTER — Other Ambulatory Visit: Payer: Self-pay

## 2021-10-29 VITALS — BP 122/74 | HR 78 | Wt 221.0 lb

## 2021-10-29 DIAGNOSIS — Z794 Long term (current) use of insulin: Secondary | ICD-10-CM

## 2021-10-29 DIAGNOSIS — E1065 Type 1 diabetes mellitus with hyperglycemia: Secondary | ICD-10-CM | POA: Diagnosis not present

## 2021-10-29 DIAGNOSIS — I10 Essential (primary) hypertension: Secondary | ICD-10-CM | POA: Diagnosis not present

## 2021-10-29 DIAGNOSIS — L83 Acanthosis nigricans: Secondary | ICD-10-CM | POA: Diagnosis not present

## 2021-10-29 DIAGNOSIS — Z23 Encounter for immunization: Secondary | ICD-10-CM | POA: Diagnosis not present

## 2021-10-29 LAB — POCT GLYCOSYLATED HEMOGLOBIN (HGB A1C): Hemoglobin A1C: 7 % — AB (ref 4.0–5.6)

## 2021-10-29 LAB — POCT GLUCOSE (DEVICE FOR HOME USE): Glucose Fasting, POC: 132 mg/dL — AB (ref 70–99)

## 2021-10-29 MED ORDER — DEXCOM G6 SENSOR MISC
6 refills | Status: DC
Start: 2021-10-29 — End: 2022-02-24

## 2021-10-29 MED ORDER — BAQSIMI ONE PACK 3 MG/DOSE NA POWD: NASAL | 5 refills | Status: AC

## 2021-10-29 MED ORDER — DEXCOM G6 TRANSMITTER MISC: 5 refills | Status: DC

## 2021-10-29 NOTE — Progress Notes (Signed)
Pediatric Endocrinology Diabetes Consultation Follow-up Visit  Krista Wang [birth year] [id number]  Chief Complaint: Follow-up type 1 diabetes   Pediatrics, [place]   HPI: Krista Wang  is a 20 y.o. female presenting for follow-up of type 1 diabetes. she is accompanied to this visit by her mother and father.  1. Krista Wang about two years ago that her urine was thick like juice. She saw her PCP and had elevated urine glucose. She went to [facility] where her HbA1c was 11%. She was diagnosed with T2DM, started on metformin, 500 mg, twice daily, and followed at [facility] until about 9 months ago. During that time her weight had continued to increase to apeak weight of 223 pounds in march 2017. Her HbA1c had decreased to about 6% as of her last visit.  Her weight has decreased from 217 pounds in May 2017 to 189 now. She has been trying to lose weight.                          About two weeks ago Krista Wang became aware that she was urinating more, had nocturia, and was drinking more. She also developed nausea, upset stomach severe enough to cause her not to want to eat, and abdominal pains. Her vision has been normal. She has lost 31 pounds since May. She presented to clinic with a A1c of >14 and urine ketones. She was admitted to the [facility] and started on two component method using Lantus and Novolog.   She had elevated insulin antibody with normal GAD and negative pancreatic islet cell antibody.   2. Since discharge from hospital on 05/2021, Krista Wang has been doing well. No Hospitalizations.   School is going well, grades have been good. She continues to work at [employer] but will be working for [employer] in the summer. Her exercise is usually walking at school. She was doing spin class but stopped going.   She is wearing Dexcom CGM which has been working well overall. She is giving Novolog injections before eating, usually takes around 7 units at meals.  Hypoglycemia is rare, she is able to feel low when she is in the 70's.   Concerns:  - Reports she is running high after lunch, usually int he 200's but comes down within 1-2 hours.    Insulin regimen: 36 units of Lantus. Novolog 120/30/6 plan  Hypoglycemia: Able to feel low blood sugars.  No glucagon needed recently.  Blood glucose download:  Dexcom CGM:     Med-alert ID: Not currently wearing. Injection sites: Arms, legs and abdomen  Annual labs due: 05/2021 Ophthalmology due: 08/2022. Discussed with family today. She made an appointment now that she has eye insurance again.     3. ROS: Greater than 10 systems reviewed with pertinent positives listed in HPI, otherwise neg. Review of Systems  Constitutional: 7 lbs weight loss. Sleeping well.  HENT: Negative.   Eyes: Negative for blurred vision and pain.  Respiratory: Negative for cough and shortness of breath.   Cardiovascular: Negative for chest pain and palpitations.  Gastrointestinal: Negative for abdominal pain, constipation, diarrhea and heartburn.  Genitourinary: Negative for frequency and urgency.  Musculoskeletal: Negative for neck pain.  Skin: Negative for itching and rash.  Neurological: Negative for dizziness, tremors, sensory change and weakness.  Endo/Heme/Allergies: Negative for polydipsia.  Psychiatric/Behavioral: Negative for depression. The patient is not nervous/anxious.      Past Medical History:   Past Medical History:  Diagnosis  Date   Allergy    Asthma    Diabetes mellitus without complication (HCC)    type 1   Gallstones    Hypertension    Evaluated by [facility] in 2019, did not follow up or have an appt in July 2019 per Nephrology's recs   Impacted ear wax 12/21/2015   Irregular menses    Nocturnal enuresis 07/01/2013   Obesity    Unspecified constipation 07/07/2013   Weight gain 07/07/2013    Medications:  Outpatient Encounter Medications as of 10/29/2021  Medication Sig Note   Accu-Chek  FastClix Lancets MISC USE 1 LANCET TO CHECK GLUCOSE SIX TIMES DAILY    ACCU-CHEK GUIDE test strip USE TO CHECK GLUCOSE 6 TIMES DAILY    acetone, urine, test strip Check ketones per protocol    BD PEN NEEDLE NANO 2ND GEN 32G X 4 MM MISC USE 1 PEN NEEDLE SIX TIMES DAILY TO INJECT INSULIN    insulin glargine (LANTUS SOLOSTAR) 100 UNIT/ML Solostar Pen INJECT UP TO 50 UNITS SUBCUTANEOUSLY ONCE DAILY (Patient taking differently: Inject 34 Units into the skin at bedtime.)    NOVOLOG FLEXPEN 100 UNIT/ML FlexPen INJECT UP TO 50 UNITS SUBCUTANEOUSLY ONCE DAILY AS DIRECTED BY  PHYSICIAN    [DISCONTINUED] BAQSIMI ONE PACK 3 MG/DOSE POWD PLACE 1 DOSE INTO THE NOSE AS NEEDED    [DISCONTINUED] Continuous Blood Gluc Sensor (DEXCOM G6 SENSOR) MISC USE TO CHECK GLUCOSE AS DIRECTED- CHANGE SENSOR EVERY 10 DAYS    [DISCONTINUED] Continuous Blood Gluc Transmit (DEXCOM G6 TRANSMITTER) MISC USE AS DIRECTED EVERY 3 MONTHS    albuterol (PROVENTIL HFA;VENTOLIN HFA) 108 (90 Base) MCG/ACT inhaler 2 puffs before exercise or every 4 to 6 hours as needed for cough or wheezing (Patient taking differently: Inhale 2 puffs into the lungs as directed. 2 puffs before exercise or every 4 to 6 hours as needed for cough or wheezing)    glucagon 1 MG injection Use for Severe Hypoglycemia . Inject 1 mg intramuscularly if unresponsive, unable to swallow, unconscious and/or has seizure (Patient taking differently: Inject 1 mg into the muscle as needed (severe hypoglycemia). Use for Severe Hypoglycemia . Inject 1 mg intramuscularly if unresponsive, unable to swallow, unconscious and/or has seizure)    norgestimate-ethinyl estradiol (SPRINTEC 28) 0.25-35 MG-MCG tablet Take 1 tablet by mouth daily. Patient overdue for yearly check up, needs to schedule (Patient not taking: Reported on 06/06/2021) 04/02/2021: Last dose 03/26/2021   ondansetron (ZOFRAN) 4 MG tablet Take 1 tablet (4 mg total) by mouth every 8 (eight) hours as needed.    oxyCODONE  (ROXICODONE) 5 MG immediate release tablet Take 1 tablet (5 mg total) by mouth every 4 (four) hours as needed for severe pain or breakthrough pain. (Patient not taking: Reported on 06/06/2021)    No facility-administered encounter medications on file as of 10/29/2021.    Allergies: Allergies  Allergen Reactions   Blue Dyes (Parenteral) Swelling    Surgical History: Past Surgical History:  Procedure Laterality Date   CHOLECYSTECTOMY N/A 04/03/2021   Procedure: LAPAROSCOPIC CHOLECYSTECTOMY;  Surgeon: Lucretia Roers, MD;  Location: [facility];  Service: General;  Laterality: N/A;   TONSILLECTOMY     WISDOM TOOTH EXTRACTION      Family History:  Family History  Problem Relation Age of Onset   Diabetes Mother    Hypertension Mother    Diabetes Father    Hypertension Father    Hypertension Maternal Grandmother    Stroke Maternal Grandmother    Diabetes  Maternal Grandmother    Thyroid disease Maternal Grandmother    Heart attack Maternal Grandfather    Diabetes Paternal Grandmother    Hypertension Paternal Grandmother    Dementia Paternal Grandmother    Kidney failure Paternal Grandfather    Hypertension Paternal Grandfather    Diabetes Paternal Grandfather    Thyroid disease Maternal Aunt       Social History: Lives with: mother and father  Sophomore at [employer]   Physical Exam:  Vitals:   10/29/21 1428  BP: 122/74  Pulse: 78  Weight: 221 lb (100.2 kg)     BP 122/74 (BP Location: Right Arm, Patient Position: Sitting, Cuff Size: Normal)    Pulse 78    Wt 221 lb (100.2 kg)    BMI 35.67 kg/m  Body mass index: body mass index is 35.67 kg/m. Blood pressure percentiles are not available for patients who are 18 years or older.  Ht Readings from Last 3 Encounters:  04/02/21 5\' 6"  (1.676 m) (75 %, Z= 0.67)*  03/26/21 5\' 6"  (1.676 m) (75 %, Z= 0.67)*  03/21/21 5\' 6"  (1.676 m) (75 %, Z= 0.67)*   * Growth percentiles are based on CDC (Girls, 2-20 Years) data.   Wt Readings  from Last 3 Encounters:  10/29/21 221 lb (100.2 kg) (99 %, Z= 2.22)*  06/06/21 228 lb (103.4 kg) (99 %, Z= 2.30)*  04/02/21 224 lb (101.6 kg) (99 %, Z= 2.25)*   * Growth percentiles are based on CDC (Girls, 2-20 Years) data.    Physical Exam  General: Well developed, well nourished  female in no acute distress.   Head: Normocephalic, atraumatic.   Eyes:  Pupils equal and round. EOMI.   Sclera white.  No eye drainage.   Ears/Nose/Mouth/Throat: Nares patent, no nasal drainage.  Normal dentition, mucous membranes moist.   Neck: supple, no cervical lymphadenopathy, no thyromegaly Cardiovascular: regular rate, normal S1/S2, no murmurs Respiratory: No increased work of breathing.  Lungs clear to auscultation bilaterally.  No wheezes. Abdomen: soft, nontender, nondistended. Normal bowel sounds.  No appreciable masses  Extremities: warm, well perfused, cap refill < 2 sec.   Musculoskeletal: Normal muscle mass.  Normal strength Skin: warm, dry.  No rash or lesions. Neurologic: alert and oriented, normal speech, no tremor    Labs:  Last Hemoglobin A1c: 7.1% on 05/2021  Results for orders placed or performed in visit on 10/29/21  POCT glycosylated hemoglobin (Hb A1C)  Result Value Ref Range   Hemoglobin A1C 7.0 (A) 4.0 - 5.6 %   HbA1c POC (<> result, manual entry)     HbA1c, POC (prediabetic range)     HbA1c, POC (controlled diabetic range)    POCT Glucose (Device for Home Use)  Result Value Ref Range   Glucose Fasting, POC 132 (A) 70 - 99 mg/dL   POC Glucose      Assessment/Plan: Krista Wang is a 20 y.o. female with type 1 diabetes on MDI and Dexcom CGM. She is doing well with diabetes care. Has a pattern of hyperglycemia after lunch, needs stronger carb ratio. Her hemoglobin A1c is 7% and TIR is >70%! Influenza vaccine given and counseling provided.     1. DM w/o complication type I, uncontrolled (HCC)/hypergylcemia/elevated a1c  - lantus to 36 units  -  Novolog 120/30/6 plan.    -  Add 1-2 units at lunch.  - Reviewed meter and CGM download. Discussed trends and patterns.  - Rotate injection sites to prevent scar tissue.  - bolus 15  minutes prior to eating to limit blood sugar spikes.  - Reviewed carb counting and importance of accurate carb counting.  - Discussed signs and symptoms of hypoglycemia. Always have glucose available.  - POCT glucose and hemoglobin A1c  - Reviewed growth chart.  - Discussed Dexcom G7 and insulin pump options.    2. Acanthosis nigricans - Stable and consistent with insulin resistance.   3. Hypertension.  - Continue to monitor but normal today. Will follow up with nephrology as needed.     Follow-up:   3 month. Mychart message with blood sugars as needed for adjustments.   >45 spent today reviewing the medical chart, counseling the patient/family, and documenting today's visit.   Gretchen Short,  FNP-C  Pediatric Specialist  [address]  [place], [address]  Tele: [phone]

## 2021-10-29 NOTE — Patient Instructions (Addendum)
-   Add 1-2 units of Novolog at lunch to your total dose   It was a pleasure seeing you in clinic today. Please do not hesitate to contact me if you have questions or concerns.   Please sign up for MyChart. This is a communication tool that allows you to send an email directly to me. This can be used for questions, prescriptions and blood sugar reports. We will also release labs to you with instructions on MyChart. Please do not use MyChart if you need immediate or emergency assistance. Ask our wonderful front office staff if you need assistance.

## 2021-12-03 ENCOUNTER — Telehealth: Payer: Self-pay

## 2021-12-03 ENCOUNTER — Encounter (INDEPENDENT_AMBULATORY_CARE_PROVIDER_SITE_OTHER): Payer: Self-pay

## 2021-12-06 ENCOUNTER — Telehealth: Payer: Self-pay

## 2021-12-24 ENCOUNTER — Other Ambulatory Visit: Payer: Self-pay | Admitting: Family

## 2022-01-19 ENCOUNTER — Other Ambulatory Visit: Payer: Self-pay

## 2022-01-19 ENCOUNTER — Ambulatory Visit
Admission: EM | Admit: 2022-01-19 | Discharge: 2022-01-19 | Disposition: A | Discharge status: Home / Self Care | Payer: 59 | Attending: Urgent Care | Admitting: Urgent Care

## 2022-01-19 DIAGNOSIS — H109 Unspecified conjunctivitis: Secondary | ICD-10-CM | POA: Diagnosis not present

## 2022-01-19 DIAGNOSIS — L03213 Periorbital cellulitis: Secondary | ICD-10-CM

## 2022-01-19 MED ORDER — CEFDINIR 300 MG PO CAPS: 300.0000 mg | ORAL_CAPSULE | Freq: Two times a day (BID) | ORAL | 0 refills | Status: DC

## 2022-01-19 MED ORDER — TOBRAMYCIN 0.3 % OP SOLN: 1.0000 [drp] | OPHTHALMIC | 0 refills | Status: DC

## 2022-01-19 NOTE — ED Triage Notes (Signed)
Pt reports pan, swelling, discharge, redness in left eye x 1 day.  ?

## 2022-01-19 NOTE — ED Provider Notes (Signed)
?[place] ? ? ?MRN: [id number] DOB: 03-03-2001 ? ?Subjective:  ? ?Krista Wang is a 21 y.o. female presenting for 1 day history of acute onset left eye redness, swelling, pain with eye movement, mucus discharge.  Initially felt like there was a foreign body and the patient started rubbing her eyes.  This worsened her symptoms.  Her eye has been irrigated at home by her mom.  Of note, she did have a cold that she got over about a week ago.  The symptoms have resolved.  Does not wear contact lenses, no traumas, exposure to dust particles. ? ?No current facility-administered medications for this encounter. ? ?Current Outpatient Medications:  ?  Accu-Chek FastClix Lancets MISC, USE 1 LANCET TO CHECK GLUCOSE SIX TIMES DAILY, Disp: 204 each, Rfl: 5 ?  ACCU-CHEK GUIDE test strip, USE TO CHECK GLUCOSE 6 TIMES DAILY, Disp: 200 each, Rfl: 5 ?  acetone, urine, test strip, Check ketones per protocol, Disp: 50 each, Rfl: 3 ?  albuterol (PROVENTIL HFA;VENTOLIN HFA) 108 (90 Base) MCG/ACT inhaler, 2 puffs before exercise or every 4 to 6 hours as needed for cough or wheezing (Patient taking differently: Inhale 2 puffs into the lungs as directed. 2 puffs before exercise or every 4 to 6 hours as needed for cough or wheezing), Disp: 2 Inhaler, Rfl: 1 ?  BD PEN NEEDLE NANO 2ND GEN 32G X 4 MM MISC, USE 1 PEN NEEDLE 6 TIMES DAILY TO INJECT INSULIN, Disp: 200 each, Rfl: 0 ?  Continuous Blood Gluc Sensor (DEXCOM G6 SENSOR) MISC, USE TO CHECK GLUCOSE AS DIRECTED- CHANGE SENSOR EVERY 10 DAYS, Disp: 3 each, Rfl: 6 ?  Continuous Blood Gluc Transmit (DEXCOM G6 TRANSMITTER) MISC, Change every 90 days, Disp: 1 each, Rfl: 5 ?  Glucagon (BAQSIMI ONE PACK) 3 MG/DOSE POWD, PLACE 1 DOSE INTO THE NOSE AS NEEDED, Disp: 2 each, Rfl: 5 ?  glucagon 1 MG injection, Use for Severe Hypoglycemia . Inject 1 mg intramuscularly if unresponsive, unable to swallow, unconscious and/or has seizure (Patient taking differently: Inject 1 mg into the  muscle as needed (severe hypoglycemia). Use for Severe Hypoglycemia . Inject 1 mg intramuscularly if unresponsive, unable to swallow, unconscious and/or has seizure), Disp: 2 kit, Rfl: 3 ?  insulin glargine (LANTUS SOLOSTAR) 100 UNIT/ML Solostar Pen, INJECT UP TO 50 UNITS SUBCUTANEOUSLY ONCE DAILY (Patient taking differently: Inject 34 Units into the skin at bedtime.), Disp: 15 mL, Rfl: 5 ?  norgestimate-ethinyl estradiol (SPRINTEC 28) 0.25-35 MG-MCG tablet, Take 1 tablet by mouth daily. Patient overdue for yearly check up, needs to schedule (Patient not taking: Reported on 06/06/2021), Disp: 30 tablet, Rfl: 11 ?  NOVOLOG FLEXPEN 100 UNIT/ML FlexPen, INJECT UP TO 50 UNITS SUBCUTANEOUSLY ONCE DAILY AS DIRECTED BY  PHYSICIAN, Disp: 45 mL, Rfl: 0 ?  ondansetron (ZOFRAN) 4 MG tablet, Take 1 tablet (4 mg total) by mouth every 8 (eight) hours as needed., Disp: 30 tablet, Rfl: 1 ?  oxyCODONE (ROXICODONE) 5 MG immediate release tablet, Take 1 tablet (5 mg total) by mouth every 4 (four) hours as needed for severe pain or breakthrough pain. (Patient not taking: Reported on 06/06/2021), Disp: 15 tablet, Rfl: 0  ? ?Allergies  ?Allergen Reactions  ? Blue Dyes (Parenteral) Swelling  ? ? ?Past Medical History:  ?Diagnosis Date  ? Allergy   ? Asthma   ? Diabetes mellitus without complication ([place])   ? type 1  ? Gallstones   ? Hypertension   ? Evaluated by [facility] in  2019, did not follow up or have an appt in July 2019 per Nephrology's recs  ? Impacted ear wax 12/21/2015  ? Irregular menses   ? Nocturnal enuresis 07/01/2013  ? Obesity   ? Unspecified constipation 07/07/2013  ? Weight gain 07/07/2013  ?  ? ?Past Surgical History:  ?Procedure Laterality Date  ? CHOLECYSTECTOMY N/A 04/03/2021  ? Procedure: LAPAROSCOPIC CHOLECYSTECTOMY;  Surgeon: Virl Cagey, MD;  Location: [facility];  Service: General;  Laterality: N/A;  ? TONSILLECTOMY    ? WISDOM TOOTH EXTRACTION    ? ? ?Family History  ?Problem Relation Age of Onset  ? Diabetes  Mother   ? Hypertension Mother   ? Diabetes Father   ? Hypertension Father   ? Hypertension Maternal Grandmother   ? Stroke Maternal Grandmother   ? Diabetes Maternal Grandmother   ? Thyroid disease Maternal Grandmother   ? Heart attack Maternal Grandfather   ? Diabetes Paternal Grandmother   ? Hypertension Paternal Grandmother   ? Dementia Paternal Grandmother   ? Kidney failure Paternal Grandfather   ? Hypertension Paternal Grandfather   ? Diabetes Paternal Grandfather   ? Thyroid disease Maternal Aunt   ? ? ?Social History  ? ?Tobacco Use  ? Smoking status: Never  ? Smokeless tobacco: Never  ?Substance Use Topics  ? Alcohol use: No  ? Drug use: No  ? ? ?ROS ? ? ?Objective:  ? ?Vitals: ?BP (!) 133/92 (BP Location: Right Arm)   Pulse 80   Temp 98.1 ?F (36.7 ?C)   Resp 16   LMP  (Within Weeks) Comment: 2 weeks  SpO2 98%  ? ?Physical Exam ?Constitutional:   ?   General: She is not in acute distress. ?   Appearance: Normal appearance. She is well-developed. She is not ill-appearing, toxic-appearing or diaphoretic.  ?HENT:  ?   Head: Normocephalic and atraumatic.  ?   Nose: Nose normal.  ?   Mouth/Throat:  ?   Mouth: Mucous membranes are moist.  ?Eyes:  ?   General: Lids are everted, no foreign bodies appreciated. No scleral icterus.    ?   Right eye: No foreign body, discharge or hordeolum.     ?   Left eye: Discharge present.No foreign body or hordeolum.  ?   Extraocular Movements: Extraocular movements intact.  ?   Conjunctiva/sclera:  ?   Right eye: Right conjunctiva is not injected. No chemosis, exudate or hemorrhage. ?   Left eye: Left conjunctiva is injected. No chemosis, exudate or hemorrhage. ?   Comments: 1+ swelling of the upper and lower eyelids with tenderness.  ?Cardiovascular:  ?   Rate and Rhythm: Normal rate.  ?Pulmonary:  ?   Effort: Pulmonary effort is normal.  ?Skin: ?   General: Skin is warm and dry.  ?Neurological:  ?   General: No focal deficit present.  ?   Mental Status: She is alert and  oriented to person, place, and time.  ?Psychiatric:     ?   Mood and Affect: Mood normal.     ?   Behavior: Behavior normal.  ? ? ?Assessment and Plan :  ? ?PDMP not reviewed this encounter. ? ?1. Preseptal cellulitis of left eye   ?2. Bacterial conjunctivitis of left eye   ? ?Recommended double coverage with cefdinir and tobramycin eyedrops.  Use supportive care otherwise.  Follow-up with her eye doctor soon as possible. Counseled patient on potential for adverse effects with medications prescribed/recommended today, ER and return-to-clinic  precautions discussed, patient verbalized understanding. ? ?  ?Jaynee Eagles, PA-C ?01/19/22 1046 ? ?

## 2022-01-29 ENCOUNTER — Telehealth (INDEPENDENT_AMBULATORY_CARE_PROVIDER_SITE_OTHER): Payer: 59 | Admitting: Family

## 2022-01-29 ENCOUNTER — Encounter (INDEPENDENT_AMBULATORY_CARE_PROVIDER_SITE_OTHER): Payer: Self-pay | Admitting: Family

## 2022-01-29 ENCOUNTER — Other Ambulatory Visit: Payer: Self-pay

## 2022-01-29 DIAGNOSIS — E1065 Type 1 diabetes mellitus with hyperglycemia: Secondary | ICD-10-CM | POA: Diagnosis not present

## 2022-01-29 DIAGNOSIS — I10 Essential (primary) hypertension: Secondary | ICD-10-CM

## 2022-01-29 DIAGNOSIS — L83 Acanthosis nigricans: Secondary | ICD-10-CM | POA: Diagnosis not present

## 2022-01-29 DIAGNOSIS — Z794 Long term (current) use of insulin: Secondary | ICD-10-CM

## 2022-01-29 NOTE — Progress Notes (Signed)
as ?This is a Pediatric Specialist E-Visit consult/follow up provided via My Chart ?Krista Jacksonconsented to an E-Visit consult today.  ?Location of patient: Krista Wang is at  home ?Location of provider: Gretchen Short, NP office ?Patient was referred by Pediatrics, [place]  ? ?The following participants were involved in this E-Visit: Krista Wang-patient, Krista Wang - provider, Krista Wang, CCMA ? ?This visit was done via VIDEO  ? ?Chief Complain/ Reason for E-Visit today: Diabetes FU  ?Total time on call: >30  spent today reviewing the medical chart, counseling the patient/family, and documenting today's visit.  ? ?Follow up: 3 months.   ? ?Pediatric Endocrinology Diabetes Consultation Follow-up Visit ? ?Krista Wang ?January 12, 2001 ?[id number] ? ?Chief Complaint: Follow-up type 1 diabetes ? ? ?Pediatrics, [place] ? ? ?HPI: ?Krista Wang  is a 21 y.o. female presenting for follow-up of type 1 diabetes. she is accompanied to this visit by her mother and father. ? ?1. Krista Wang noted about two years ago that her urine was thick like juice. She saw her PCP and had elevated urine glucose. She went to [facility] where her HbA1c was 11%. She was diagnosed with T2DM, started on metformin, 500 mg, twice daily, and followed at [facility] until about 9 months ago. During that time her weight had continued to increase to apeak weight of 223 pounds in march 2017. Her HbA1c had decreased to about 6% as of her last visit.  Her weight has decreased from 217 pounds in May 2017 to 189 now. She has been trying to lose weight.  ?                        About two weeks ago Krista Wang became aware that she was urinating more, had nocturia, and was drinking more. She also developed nausea, upset stomach severe enough to cause her not to want to eat, and abdominal pains. Her vision has been normal. She has lost 31 pounds since May. She presented to clinic with a A1c of >14 and urine ketones. She was admitted to the [facility] and started on two component method using Lantus and Novolog.  ? ?She had elevated insulin antibody with normal GAD and negative pancreatic islet cell antibody.  ? ?2. Since discharge from hospital on 10/2021, Krista Wang has been doing well. No Hospitalizations.  ? ?Krista Wang is doing well in school currently at [employer]. She has started playing tennis and doing spin classes a few days per week for activity. Working at [employer] doing [employer].  ? ?She feels like she is doing well with diabetes care. Using Dexcom CGM which is working well. She is considering upgrading to insulin pump therapy in the near future. She gives Novolog injections before eating; carb counting accurately. Hypoglycemia hs been very rare. She is able to feel symptoms when hypoglycemic.  ? ?Insulin regimen: 36 units of Lantus. Novolog 120/30/6 plan  ?Hypoglycemia: Able to feel low blood sugars.  No glucagon needed recently.  ?Blood glucose download:  ?Dexcom CGM:  ? ? ? ?Med-alert ID: Not currently wearing. ?Injection sites: Arms, legs and abdomen  ?Annual labs due: 05/2022 ?Ophthalmology due: 08/2022. Discussed with family today. She made an appointment now that she has eye insurance again.  ? ?  ?3. ROS: Greater than 10 systems reviewed with pertinent positives listed in HPI, otherwise neg. ?Review of Systems  ?Constitutional:. Sleeping well.  ?HENT: Negative.   ?Eyes: Negative for blurred vision and pain.  ?Respiratory: Negative for cough  and shortness of breath.   ?Cardiovascular: Negative for chest pain and palpitations.  ?Gastrointestinal: Negative for abdominal pain, constipation, diarrhea and heartburn.  ?Genitourinary: Negative for frequency and urgency.  ?Musculoskeletal: Negative for neck pain.  ?Skin: Negative for itching and rash.  ?Neurological: Negative for dizziness, tremors, sensory change and weakness.  ?Endo/Heme/Allergies: Negative for polydipsia.  ?Psychiatric/Behavioral: Negative for depression. The patient is  not nervous/anxious.   ? ? ? ?Past Medical History:   ?Past Medical History:  ?Diagnosis Date  ? Allergy   ? Asthma   ? Diabetes mellitus without complication (HCC)   ? type 1  ? Gallstones   ? Hypertension   ? [facility], did not follow up or have an appt in July 2019 per Nephrology's recs  ? Impacted ear wax 12/21/2015  ? Irregular menses   ? Nocturnal enuresis 07/01/2013  ? Obesity   ? Unspecified constipation 07/07/2013  ? Weight gain 07/07/2013  ? ? ?Medications:  ?Outpatient Encounter Medications as of 01/29/2022  ?Medication Sig Note  ? cefdinir (OMNICEF) 300 MG capsule Take 1 capsule (300 mg total) by mouth 2 (two) times daily.   ? Continuous Blood Gluc Sensor (DEXCOM G6 SENSOR) MISC USE TO CHECK GLUCOSE AS DIRECTED- CHANGE SENSOR EVERY 10 DAYS   ? Continuous Blood Gluc Transmit (DEXCOM G6 TRANSMITTER) MISC Change every 90 days   ? Glucagon (BAQSIMI ONE PACK) 3 MG/DOSE POWD PLACE 1 DOSE INTO THE NOSE AS NEEDED   ? insulin glargine (LANTUS SOLOSTAR) 100 UNIT/ML Solostar Pen INJECT UP TO 50 UNITS SUBCUTANEOUSLY ONCE DAILY (Patient taking differently: Inject 34 Units into the skin at bedtime.)   ? NOVOLOG FLEXPEN 100 UNIT/ML FlexPen INJECT UP TO 50 UNITS SUBCUTANEOUSLY ONCE DAILY AS DIRECTED BY  PHYSICIAN   ? Accu-Chek FastClix Lancets MISC USE 1 LANCET TO CHECK GLUCOSE SIX TIMES DAILY (Patient not taking: Reported on 01/29/2022)   ? ACCU-CHEK GUIDE test strip USE TO CHECK GLUCOSE 6 TIMES DAILY (Patient not taking: Reported on 01/29/2022)   ? acetone, urine, test strip Check ketones per protocol (Patient not taking: Reported on 01/29/2022)   ? albuterol (PROVENTIL HFA;VENTOLIN HFA) 108 (90 Base) MCG/ACT inhaler 2 puffs before exercise or every 4 to 6 hours as needed for cough or wheezing (Patient not taking: Reported on 01/29/2022)   ? BD PEN NEEDLE NANO 2ND GEN 32G X 4 MM MISC USE 1 PEN NEEDLE 6 TIMES DAILY TO INJECT INSULIN (Patient not taking: Reported on 01/29/2022)   ? glucagon 1 MG injection  Use for Severe Hypoglycemia . Inject 1 mg intramuscularly if unresponsive, unable to swallow, unconscious and/or has seizure (Patient not taking: Reported on 01/29/2022)   ? norgestimate-ethinyl estradiol (SPRINTEC 28) 0.25-35 MG-MCG tablet Take 1 tablet by mouth daily. Patient overdue for yearly check up, needs to schedule (Patient not taking: Reported on 06/06/2021) 04/02/2021: Last dose 03/26/2021  ? ondansetron (ZOFRAN) 4 MG tablet Take 1 tablet (4 mg total) by mouth every 8 (eight) hours as needed. (Patient not taking: Reported on 01/29/2022)   ? oxyCODONE (ROXICODONE) 5 MG immediate release tablet Take 1 tablet (5 mg total) by mouth every 4 (four) hours as needed for severe pain or breakthrough pain. (Patient not taking: Reported on 06/06/2021)   ? tobramycin (TOBREX) 0.3 % ophthalmic solution Place 1 drop into the left eye every 4 (four) hours. (Patient not taking: Reported on 01/29/2022)   ? ?No facility-administered encounter medications on file as of 01/29/2022.  ? ? ?Allergies: ?  Allergies  ?Allergen Reactions  ? Blue Dyes (Parenteral) Swelling  ? ? ?Surgical History: ?Past Surgical History:  ?Procedure Laterality Date  ? CHOLECYSTECTOMY N/A 04/03/2021  ? Procedure: LAPAROSCOPIC CHOLECYSTECTOMY;  Surgeon: Lucretia Roers, MD;  Location: [facility];  Service: General;  Laterality: N/A;  ? TONSILLECTOMY    ? WISDOM TOOTH EXTRACTION    ? ? ?Family History:  ?Family History  ?Problem Relation Age of Onset  ? Diabetes Mother   ? Hypertension Mother   ? Diabetes Father   ? Hypertension Father   ? Hypertension Maternal Grandmother   ? Stroke Maternal Grandmother   ? Diabetes Maternal Grandmother   ? Thyroid disease Maternal Grandmother   ? Heart attack Maternal Grandfather   ? Diabetes Paternal Grandmother   ? Hypertension Paternal Grandmother   ? Dementia Paternal Grandmother   ? Kidney failure Paternal Grandfather   ? Hypertension Paternal Grandfather   ? Diabetes Paternal Grandfather   ? Thyroid disease Maternal Aunt    ? ? ?  ?Social History: ?Lives with: Dorm at college.  ?Sophomore at [employer]  ? ?Physical Exam:  ?There were no vitals filed for this visit. ? ? ? ?There were no vitals taken for this visit. ?Body mass in

## 2022-02-21 ENCOUNTER — Encounter (INDEPENDENT_AMBULATORY_CARE_PROVIDER_SITE_OTHER): Payer: Self-pay

## 2022-02-24 ENCOUNTER — Other Ambulatory Visit (INDEPENDENT_AMBULATORY_CARE_PROVIDER_SITE_OTHER): Payer: Self-pay | Admitting: Family

## 2022-02-24 MED ORDER — DEXCOM G7 SENSOR MISC: 6 refills | Status: DC

## 2022-03-20 ENCOUNTER — Encounter: Payer: Self-pay | Admitting: *Deleted

## 2022-03-21 ENCOUNTER — Other Ambulatory Visit: Payer: Self-pay | Admitting: Family

## 2022-05-26 ENCOUNTER — Ambulatory Visit (INDEPENDENT_AMBULATORY_CARE_PROVIDER_SITE_OTHER): Payer: 59 | Admitting: Adult Health

## 2022-05-26 ENCOUNTER — Encounter: Payer: Self-pay | Admitting: Adult Health

## 2022-05-26 VITALS — BP 124/78 | HR 77 | Ht 64.0 in | Wt 226.5 lb

## 2022-05-26 DIAGNOSIS — F419 Anxiety disorder, unspecified: Secondary | ICD-10-CM

## 2022-05-26 DIAGNOSIS — N926 Irregular menstruation, unspecified: Secondary | ICD-10-CM | POA: Insufficient documentation

## 2022-05-26 DIAGNOSIS — Z113 Encounter for screening for infections with a predominantly sexual mode of transmission: Secondary | ICD-10-CM | POA: Insufficient documentation

## 2022-05-26 DIAGNOSIS — F32A Depression, unspecified: Secondary | ICD-10-CM | POA: Insufficient documentation

## 2022-05-26 DIAGNOSIS — Z3202 Encounter for pregnancy test, result negative: Secondary | ICD-10-CM

## 2022-05-26 DIAGNOSIS — Z30011 Encounter for initial prescription of contraceptive pills: Secondary | ICD-10-CM

## 2022-05-26 LAB — POCT URINE PREGNANCY: Preg Test, Ur: NEGATIVE

## 2022-05-26 MED ORDER — LO LOESTRIN FE 1 MG-10 MCG / 10 MCG PO TABS: 1.0000 | ORAL_TABLET | Freq: Every day | ORAL | 11 refills | Status: DC

## 2022-05-26 MED ORDER — MEDROXYPROGESTERONE ACETATE 10 MG PO TABS: 10.0000 mg | ORAL_TABLET | Freq: Every day | ORAL | 0 refills | Status: DC

## 2022-05-26 NOTE — Progress Notes (Signed)
  Subjective:     Patient ID: Krista Wang, female   DOB: [birth year], 21 y.o.   MRN: [id number]  HPI Krista Wang is a 21 year old black female, single, G0P0, in to discuss birth control. She has irregular periods, none since January. She is going to [employer] for psychology.   PCP is [facility].  Review of Systems No periods not regular, last one in January Has had sex, no problems She OCs before and stopped, had PMS  Reviewed past medical,surgical, social and family history. Reviewed medications and allergies.     Objective:   Physical Exam BP 124/78 (BP Location: Right Arm, Patient Position: Sitting, Cuff Size: Large)   Pulse 77   Ht 5\' 4"  (1.626 m)   Wt 226 lb 8 oz (102.7 kg)   LMP 12/02/2021 Comment: no period since Jan.  BMI 38.88 kg/m     UPT is negative Skin warm and dry. Neck: mid line trachea, normal thyroid, good ROM, no lymphadenopathy noted. Lungs: clear to ausculation bilaterally. Cardiovascular: regular rate and rhythm.  AA is 0 Fall risk is low    05/26/2022    3:21 PM 08/26/2018    9:40 AM  Depression screen PHQ 2/9  Decreased Interest 1 0  Down, Depressed, Hopeless 2 0  PHQ - 2 Score 3 0  Altered sleeping 2   Tired, decreased energy 2   Change in appetite 1   Feeling bad or failure about yourself  1   Trouble concentrating 0   Moving slowly or fidgety/restless 0   Suicidal thoughts 0   PHQ-9 Score 9        05/26/2022    3:22 PM  GAD 7 : Generalized Anxiety Score  Nervous, Anxious, on Edge 3  Control/stop worrying 1  Worry too much - different things 1  Trouble relaxing 1  Restless 1  Easily annoyed or irritable 2  Afraid - awful might happen 1  Total GAD 7 Score 10      Upstream - 05/26/22 1530       Pregnancy Intention Screening   Does the patient want to become pregnant in the next year? No    Does the patient's partner want to become pregnant in the next year? No    Would the patient like to discuss contraceptive options today? Yes       Contraception Wrap Up   Current Method Female Condom    End Method Female Condom;Oral Contraceptive    Contraception Counseling Provided Yes             Assessment:     1. Pregnancy examination or test, negative result  2. Screening examination for STD (sexually transmitted disease) GC/CHl on urine sent   3. Irregular menses Will rx lo Loestrin to regulate cycles after we get withdrawal bleed from depo provera 10 mg daily for 10 days  4. Encounter for initial prescription of contraceptive pills Will rx lo Loestrin, bring to appt in 2 weeks   5. Anxiety and depression Declines meds for now  6. Missed periods Will rx provera 10 mg x 10 days to get withdrawal bleed    Plan:     Follow up with me in 2 weeks

## 2022-05-28 LAB — GC/CHLAMYDIA PROBE AMP
Chlamydia trachomatis, NAA: NEGATIVE
Neisseria Gonorrhoeae by PCR: NEGATIVE

## 2022-06-09 ENCOUNTER — Ambulatory Visit (INDEPENDENT_AMBULATORY_CARE_PROVIDER_SITE_OTHER): Payer: 59 | Admitting: Adult Health

## 2022-06-09 ENCOUNTER — Encounter: Payer: Self-pay | Admitting: Adult Health

## 2022-06-09 VITALS — Ht 64.0 in | Wt 226.0 lb

## 2022-06-09 DIAGNOSIS — N926 Irregular menstruation, unspecified: Secondary | ICD-10-CM | POA: Diagnosis not present

## 2022-06-09 NOTE — Progress Notes (Signed)
Patient ID: Krista Wang, female   DOB: January 11, 2001, 21 y.o.   MRN: [id number]   TELEHEALTH GYNECOLOGY VISIT ENCOUNTER NOTE  [facility]   Patient location: Home  I connected with Krista Wang on 06/09/22 at  2:10 PM EDT by telephone and verified that I am speaking with the correct person using two identifiers. Patient was unable to do MyChart audiovisual encounter due to technical difficulties, she tried several times.    I discussed the limitations, risks, security and privacy concerns of performing an evaluation and management service by telephone and the availability of in person appointments. I also discussed with the patient that there may be a patient responsible charge related to this service. The patient expressed understanding and agreed to proceed.   History:  Krista Wang is a 21 y.o. G0P0000 female being evaluated today for starting period after taking provera and it started today, she has lo Loestrin at home to start on Sunday. . She denies any abnormal vaginal discharge, bleeding, pelvic pain or other concerns.       Past Medical History:  Diagnosis Date   Allergy    Asthma    Diabetes mellitus without complication (HCC)    type 1   Gallstones    Hypertension    Evaluated by [facility] in 2019, did not follow up or have an appt in July 2019 per Nephrology's recs   Impacted ear wax 12/21/2015   Irregular menses    Nocturnal enuresis 07/01/2013   Obesity    Unspecified constipation 07/07/2013   Weight gain 07/07/2013   Past Surgical History:  Procedure Laterality Date   CHOLECYSTECTOMY N/A 04/03/2021   Procedure: LAPAROSCOPIC CHOLECYSTECTOMY;  Surgeon: Lucretia Roers, MD;  Location: [facility];  Service: General;  Laterality: N/A;   TONSILLECTOMY     WISDOM TOOTH EXTRACTION     The following portions of the patient's history were reviewed and updated as appropriate: allergies, current medications, past family  history, past medical history, past social history, past surgical history and problem list.   Health Maintenance: no pap yet  Review of Systems:  Pertinent items noted in HPI and remainder of comprehensive ROS otherwise negative.  Physical Exam:   General:  Alert, oriented and cooperative.   Mental Status: Normal mood and affect perceived. Normal judgment and thought content.  Physical exam deferred due to nature of the encounter Ht 5\' 4"  (1.626 m)   Wt 226 lb (102.5 kg)   LMP 06/09/2022   BMI 38.79 kg/m    Upstream - 06/09/22 1436       Pregnancy Intention Screening   Does the patient want to become pregnant in the next year? No    Does the patient's partner want to become pregnant in the next year? No    Would the patient like to discuss contraceptive options today? No      Contraception Wrap Up   Current Method Female Condom    End Method Female Condom;Oral Contraceptive    Contraception Counseling Provided No             Labs and Imaging Results for orders placed or performed in visit on 05/26/22 (from the past 336 hour(s))  POCT urine pregnancy   Collection Time: 05/26/22  3:26 PM  Result Value Ref Range   Preg Test, Ur Negative Negative  GC/Chlamydia Probe Amp   Collection Time: 05/26/22  4:30 PM   UR  Result Value Ref Range  Chlamydia trachomatis, NAA Negative Negative   Neisseria Gonorrhoeae by PCR Negative Negative   No results found.    Assessment and Plan:      1. Irregular menses Has rx for lo loestrin, will start on Sunday Will follow up in 3 months for ROS  2. Missed periods Period started today after taking provera       I discussed the assessment and treatment plan with the patient. The patient was provided an opportunity to ask questions and all were answered. The patient agreed with the plan and demonstrated an understanding of the instructions.   The patient was advised to call back or seek an in-person evaluation/go to the ED if the  symptoms worsen or if the condition fails to improve as anticipated.  I provided 8 minutes of non-face-to-face time during this encounter. I was in my office at [facility] during this encounter   Cyril Mourning, NP [facility] [employer], [facility]

## 2022-06-09 NOTE — Progress Notes (Deleted)
Patient ID: Krista Wang, female   DOB: 02-Jan-2001, 21 y.o.   MRN: [id number] .cwht

## 2022-06-18 ENCOUNTER — Ambulatory Visit (INDEPENDENT_AMBULATORY_CARE_PROVIDER_SITE_OTHER): Payer: 59 | Admitting: Adult Health

## 2022-06-18 ENCOUNTER — Encounter: Payer: Self-pay | Admitting: Adult Health

## 2022-06-18 VITALS — Ht 64.0 in

## 2022-06-18 DIAGNOSIS — F419 Anxiety disorder, unspecified: Secondary | ICD-10-CM | POA: Diagnosis not present

## 2022-06-18 DIAGNOSIS — F32A Depression, unspecified: Secondary | ICD-10-CM

## 2022-06-18 MED ORDER — HYDROXYZINE PAMOATE 25 MG PO CAPS: 25.0000 mg | ORAL_CAPSULE | Freq: Three times a day (TID) | ORAL | 0 refills | Status: DC | PRN

## 2022-06-18 MED ORDER — ESCITALOPRAM OXALATE 10 MG PO TABS: 10.0000 mg | ORAL_TABLET | Freq: Every day | ORAL | 2 refills | Status: DC

## 2022-06-18 NOTE — Progress Notes (Signed)
Patient ID: Krista Wang, female   DOB: 2000/12/01, 21 y.o.   MRN: [id number]   TELEHEALTH GYNECOLOGY VISIT ENCOUNTER NOTE  Provider location: [facility]   Patient location: Home  I connected with Krista Wang on 06/18/22 at  8:30 AM EDT by telephone and verified that I am speaking with the correct person using two identifiers. Patient was unable to do MyChart audiovisual encounter due to technical difficulties, she tried several times.    I discussed the limitations, risks, security and privacy concerns of performing an evaluation and management service by telephone and the availability of in person appointments. I also discussed with the patient that there may be a patient responsible charge related to this service. The patient expressed understanding and agreed to proceed.   History:  Krista Wang is a 21 y.o. G0P0000 female being evaluated today for feeling more anxious, teary, feels like she wants to throw and can't breathe. She denies any SI,HI or other concerns.       Past Medical History:  Diagnosis Date   Allergy    Asthma    Diabetes mellitus without complication (HCC)    type 1   Gallstones    Hypertension    Evaluated by [facility] in 2019, did not follow up or have an appt in July 2019 per Nephrology's recs   Impacted ear wax 12/21/2015   Irregular menses    Nocturnal enuresis 07/01/2013   Obesity    Unspecified constipation 07/07/2013   Weight gain 07/07/2013   Past Surgical History:  Procedure Laterality Date   CHOLECYSTECTOMY N/A 04/03/2021   Procedure: LAPAROSCOPIC CHOLECYSTECTOMY;  Surgeon: Lucretia Roers, MD;  Location: [facility];  Service: General;  Laterality: N/A;   TONSILLECTOMY     WISDOM TOOTH EXTRACTION     The following portions of the patient's history were reviewed and updated as appropriate: allergies, current medications, past family history, past medical history, past social history, past surgical history and  problem list.   Health Maintenance: no pap yet  Review of Systems:  Pertinent items noted in HPI and remainder of comprehensive ROS otherwise negative.  Physical Exam:   General:  Alert, oriented and cooperative.   Mental Status: Normal mood and affect perceived. Normal judgment and thought content.  Physical exam deferred due to nature of the encounter Ht 5\' 4"  (1.626 m)   LMP 06/09/2022   BMI 38.79 kg/m   Fall risk is low  Upstream - 06/18/22 08/18/22       Pregnancy Intention Screening   Does the patient want to become pregnant in the next year? No    Does the patient's partner want to become pregnant in the next year? No    Would the patient like to discuss contraceptive options today? No      Contraception Wrap Up   Current Method Oral Contraceptive    End Method Oral Contraceptive                06/18/2022    8:32 AM 05/26/2022    3:21 PM 08/26/2018    9:40 AM  Depression screen PHQ 2/9  Decreased Interest 1 1 0  Down, Depressed, Hopeless 1 2 0  PHQ - 2 Score 2 3 0  Altered sleeping 3 2   Tired, decreased energy 3 2   Change in appetite 3 1   Feeling bad or failure about yourself  1 1   Trouble concentrating 3 0   Moving slowly  or fidgety/restless 1 0   Suicidal thoughts 0 0   PHQ-9 Score 16 9        06/18/2022    8:34 AM 05/26/2022    3:22 PM  GAD 7 : Generalized Anxiety Score  Nervous, Anxious, on Edge 3 3  Control/stop worrying 3 1  Worry too much - different things 3 1  Trouble relaxing 3 1  Restless 3 1  Easily annoyed or irritable 3 2  Afraid - awful might happen 3 1  Total GAD 7 Score 21 10      Labs and Imaging No results found for this or any previous visit (from the past 336 hour(s)). No results found.    Assessment and Plan:     1. Anxiety and depression She declines BH referral Will start on lexapro 10 mg 1 daily and vistaril 25 mg prn Meds ordered this encounter  Medications   escitalopram (LEXAPRO) 10 MG tablet    Sig: Take 1  tablet (10 mg total) by mouth daily.    Dispense:  30 tablet    Refill:  2    Order Specific Question:   Supervising Provider    Answer:   Duane Lope H [[id number]]   hydrOXYzine (VISTARIL) 25 MG capsule    Sig: Take 1 capsule (25 mg total) by mouth 3 (three) times daily as needed.    Dispense:  30 capsule    Refill:  0    Order Specific Question:   Supervising Provider    Answer:   Duane Lope H [[id number]]    Follow up with me in 4 weeks or sooner if needed       I discussed the assessment and treatment plan with the patient. The patient was provided an opportunity to ask questions and all were answered. The patient agreed with the plan and demonstrated an understanding of the instructions.   The patient was advised to call back or seek an in-person evaluation/go to the ED if the symptoms worsen or if the condition fails to improve as anticipated.  I provided 10 minutes of non-face-to-face time during this encounter. I was in my office at [facility] tree for this encounter   Cyril Mourning, NP [facility] [employer], [facility]

## 2022-06-24 ENCOUNTER — Telehealth: Payer: 59 | Admitting: Adult Health

## 2022-08-01 ENCOUNTER — Encounter (INDEPENDENT_AMBULATORY_CARE_PROVIDER_SITE_OTHER): Payer: Self-pay | Admitting: Family

## 2022-08-01 ENCOUNTER — Ambulatory Visit (INDEPENDENT_AMBULATORY_CARE_PROVIDER_SITE_OTHER): Payer: 59 | Admitting: Family

## 2022-08-01 VITALS — BP 118/78 | HR 116 | Wt 220.6 lb

## 2022-08-01 DIAGNOSIS — E1065 Type 1 diabetes mellitus with hyperglycemia: Secondary | ICD-10-CM | POA: Diagnosis not present

## 2022-08-01 DIAGNOSIS — Z794 Long term (current) use of insulin: Secondary | ICD-10-CM

## 2022-08-01 DIAGNOSIS — I10 Essential (primary) hypertension: Secondary | ICD-10-CM | POA: Diagnosis not present

## 2022-08-01 LAB — POCT GLYCOSYLATED HEMOGLOBIN (HGB A1C): Hemoglobin A1C: 7.1 % — AB (ref 4.0–5.6)

## 2022-08-01 LAB — POCT GLUCOSE (DEVICE FOR HOME USE): Glucose Fasting, POC: 147 mg/dL — AB (ref 70–99)

## 2022-08-01 NOTE — Patient Instructions (Signed)
-   Increase Lantus to 39 units. If still running high after 2-3 days, increase to 40.   - It was a pleasure seeing you in clinic today. Please do not hesitate to contact me if you have questions or concerns.   Please sign up for MyChart. This is a communication tool that allows you to send an email directly to me. This can be used for questions, prescriptions and blood sugar reports. We will also release labs to you with instructions on MyChart. Please do not use MyChart if you need immediate or emergency assistance. Ask our wonderful front office staff if you need assistance.

## 2022-08-01 NOTE — Progress Notes (Signed)
Pediatric Endocrinology Diabetes Consultation Follow-up Visit  Krista Wang 2001-07-01 [id number]  Chief Complaint: Follow-up type 1 diabetes   Pcp, No   HPI: Krista Wang  is a 21 y.o. female presenting for follow-up of type 1 diabetes. she is accompanied to this visit by her mother and father.  1. [name] noted about two years ago that her urine was thick like juice. She saw her PCP and had elevated urine glucose. She went to [facility] where her HbA1c was 11%. She was diagnosed with T2DM, started on metformin, 500 mg, twice daily, and followed at [facility] until about 9 months ago. During that time her weight had continued to increase to apeak weight of 223 pounds in march 2017. Her HbA1c had decreased to about 6% as of her last visit.  Her weight has decreased from 217 pounds in May 2017 to 189 now. She has been trying to lose weight.                          About two weeks ago Krista Wang became aware that she was urinating more, had nocturia, and was drinking more. She also developed nausea, upset stomach severe enough to cause her not to want to eat, and abdominal pains. Her vision has been normal. She has lost 31 pounds since May. She presented to clinic with a A1c of >14 and urine ketones. She was admitted to the [facility] and started on two component method using Lantus and Novolog.   She had elevated insulin antibody with normal GAD and negative pancreatic islet cell antibody.   2. Since discharge from hospital on 01/2021, Krista Wang has been doing well. No Hospitalizations.   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 Exercising 4 days per week, playing tennis and going to spin classes.   She is taking all of her insulin as prescribed, feels confident in carb counting but blood sugars have been running high most of the time. No changes in diet or activity recently but she has been very stressed. Wearing Dexcom CGM.  Hypoglycemia is rare, no glucagon.   She started started taking Lexapro 10 mg once daily since July. Also hydroxyzine for severe anxiety but has only taken it 3 x.   Insulin regimen: 37 units of Lantus. Novolog 120/30/6 plan  Hypoglycemia: Able to feel low blood sugars.  No glucagon needed recently.  Blood glucose download:  Dexcom CGM:     Med-alert ID: Not currently wearing. Injection sites: Arms, legs and abdomen  Annual labs due: 05/2022 Ophthalmology due: 08/2022. Discussed with family today. Has appointment set up     3. ROS: Greater than 10 systems reviewed with pertinent positives listed in HPI, otherwise neg. Review of Systems  Constitutional:. Sleeping well.  HENT: Negative.   Eyes: Negative for blurred vision and pain.  Respiratory: Negative for cough and shortness of breath.   Cardiovascular: Negative for chest pain and palpitations.  Gastrointestinal: Negative for abdominal pain, constipation, diarrhea and heartburn.  Genitourinary: Negative for frequency and urgency.  Musculoskeletal: Negative for neck pain.  Skin: Negative for itching and rash.  Neurological: Negative for dizziness, tremors, sensory change and weakness.  Endo/Heme/Allergies: Negative for polydipsia.  Psychiatric/Behavioral: Negative for depression. The patient is not nervous/anxious.      Past Medical History:   Past Medical History:  Diagnosis Date   Allergy    Asthma    Diabetes  mellitus without complication (HCC)    type 1   Gallstones    Hypertension    Evaluated by [facility] in 2019, did not follow up or have an appt in July 2019 per Nephrology's recs   Impacted ear wax 12/21/2015   Irregular menses    Nocturnal enuresis 07/01/2013   Obesity    Unspecified constipation 07/07/2013   Weight gain 07/07/2013    Medications:  Outpatient Encounter Medications as of 08/01/2022  Medication Sig Note   Continuous Blood Gluc Sensor (DEXCOM G7 SENSOR) MISC Change sensor every 10 days. 3 per  month.    Continuous Blood Gluc Transmit (DEXCOM G6 TRANSMITTER) MISC Change every 90 days    escitalopram (LEXAPRO) 10 MG tablet Take 1 tablet (10 mg total) by mouth daily.    hydrOXYzine (VISTARIL) 25 MG capsule Take 1 capsule (25 mg total) by mouth 3 (three) times daily as needed.    LANTUS SOLOSTAR 100 UNIT/ML Solostar Pen INJECT UP TO 50 UNITS SUBCUTANEOUSLY ONCE DAILY    NOVOLOG FLEXPEN 100 UNIT/ML FlexPen INJECT UP TO 50 UNITS SUBCUTANEOUSLY ONCE DAILY AS DIRECTED BY  PHYSICIAN    Accu-Chek FastClix Lancets MISC USE 1 LANCET TO CHECK GLUCOSE SIX TIMES DAILY (Patient not taking: Reported on 08/01/2022)    ACCU-CHEK GUIDE test strip USE TO CHECK GLUCOSE 6 TIMES DAILY (Patient not taking: Reported on 08/01/2022)    acetone, urine, test strip Check ketones per protocol (Patient not taking: Reported on 08/01/2022)    albuterol (PROVENTIL HFA;VENTOLIN HFA) 108 (90 Base) MCG/ACT inhaler 2 puffs before exercise or every 4 to 6 hours as needed for cough or wheezing (Patient not taking: Reported on 08/01/2022) 06/09/2022: Needs refill   BD PEN NEEDLE NANO 2ND GEN 32G X 4 MM MISC USE 1 PEN NEEDLE 6 TIMES DAILY TO INJECT INSULIN (Patient not taking: Reported on 08/01/2022)    Glucagon (BAQSIMI ONE PACK) 3 MG/DOSE POWD PLACE 1 DOSE INTO THE NOSE AS NEEDED (Patient not taking: Reported on 08/01/2022)    glucagon 1 MG injection Use for Severe Hypoglycemia . Inject 1 mg intramuscularly if unresponsive, unable to swallow, unconscious and/or has seizure (Patient not taking: Reported on 08/01/2022)    Norethindrone-Ethinyl Estradiol-Fe Biphas (LO LOESTRIN FE) 1 MG-10 MCG / 10 MCG tablet Take 1 tablet by mouth daily. Take 1 daily by mouth (Patient not taking: Reported on 08/01/2022)    No facility-administered encounter medications on file as of 08/01/2022.    Allergies: Allergies  Allergen Reactions   Blue Dyes (Parenteral) Swelling    Surgical History: Past Surgical History:  Procedure Laterality Date    CHOLECYSTECTOMY N/A 04/03/2021   Procedure: LAPAROSCOPIC CHOLECYSTECTOMY;  Surgeon: Lucretia Roers, MD;  Location: [facility];  Service: General;  Laterality: N/A;   TONSILLECTOMY     WISDOM TOOTH EXTRACTION      Family History:  Family History  Problem Relation Age of Onset   Diabetes Mother    Hypertension Mother    Diabetes Father    Hypertension Father    Hypertension Maternal Grandmother    Stroke Maternal Grandmother    Diabetes Maternal Grandmother    Thyroid disease Maternal Grandmother    Heart attack Maternal Grandfather    Diabetes Paternal Grandmother    Hypertension Paternal Grandmother    Dementia Paternal Grandmother    Kidney failure Paternal Grandfather    Hypertension Paternal Grandfather    Diabetes Paternal Grandfather    Thyroid disease Maternal Aunt  Social History: Lives with: Dorm at college.  Junior at [employer]   Physical Exam:  Vitals:   08/01/22 0857  BP: 118/78  Pulse: (!) 116  Weight: 220 lb 9.6 oz (100.1 kg)      BP 118/78   Pulse (!) 116   Wt 220 lb 9.6 oz (100.1 kg)   BMI 37.87 kg/m  Body mass index: body mass index is 37.87 kg/m. Growth %ile SmartLinks can only be used for patients less than 45 years old.  Ht Readings from Last 3 Encounters:  06/18/22 5\' 4"  (1.626 m)  06/09/22 5\' 4"  (1.626 m)  05/26/22 5\' 4"  (1.626 m)   Wt Readings from Last 3 Encounters:  08/01/22 220 lb 9.6 oz (100.1 kg)  06/09/22 226 lb (102.5 kg)  05/26/22 226 lb 8 oz (102.7 kg)    Physical Exam  General: Well developed, well nourished female in no acute distress.   Head: Normocephalic, atraumatic.   Eyes:  Pupils equal and round. EOMI.   Sclera white.  No eye drainage.   Ears/Nose/Mouth/Throat: Nares patent, no nasal drainage.  Normal dentition, mucous membranes moist.   Neck: supple, no cervical lymphadenopathy, no thyromegaly Cardiovascular: regular rate, normal S1/S2, no murmurs Respiratory: No increased work of breathing.  Lungs clear to  auscultation bilaterally.  No wheezes. Abdomen: soft, nontender, nondistended. No appreciable masses  Extremities: warm, well perfused, cap refill < 2 sec.   Musculoskeletal: Normal muscle mass.  Normal strength Skin: warm, dry.  No rash or lesions. Neurologic: alert and oriented, normal speech, no tremor    Labs:  Last Hemoglobin A1c: 7.0% on 10/2021  Results for orders placed or performed in visit on 08/01/22  POCT glycosylated hemoglobin (Hb A1C)  Result Value Ref Range   Hemoglobin A1C 7.1 (A) 4.0 - 5.6 %   HbA1c POC (<> result, manual entry)     HbA1c, POC (prediabetic range)     HbA1c, POC (controlled diabetic range)    POCT Glucose (Device for Home Use)  Result Value Ref Range   Glucose Fasting, POC 147 (A) 70 - 99 mg/dL   POC Glucose      Assessment/Plan: Jalecia is a 21 y.o. female with type 1 diabetes on MDI and Dexcom CGM. She is having more hyperglycemia, blood sugars trending around 200. Increase lantus dose. Hemoglobin A1c is 7.1% which is higher then ADA goal of <7..    1. DM w/o complication type I, uncontrolled (HCC)/hypergylcemia/ 2. Insulin dose change.  -Increase lantus to 39 units.  -  Novolog 120/30/6 plan.   - Reviewed insulin pump and CGM download. Discussed trends and patterns.  - Rotate pump sites to prevent scar tissue.  - bolus 15 minutes prior to eating to limit blood sugar spikes.  - Reviewed carb counting and importance of accurate carb counting.  - Discussed signs and symptoms of hypoglycemia. Always have glucose available.  - POCT glucose and hemoglobin A1c  - Reviewed growth chart.  - Discussed options for insulin pump and CGm therapy. Discussed benefits of closed loop pumps.  - CMP, Lipid panel, TFTs and microalbumin ordered.   3. Hypertension.  Follow with nephrology as needed.     Follow-up:   3 month. Mychart message with blood sugars as needed for adjustments.   LOS: >45 spent today reviewing the medical chart, counseling the  patient/family, and documenting today's visit.    11/2021,  FNP-C  Pediatric Specialist  [address]  [place], [address]  Tele:  [phone]      

## 2022-08-02 LAB — LIPID PANEL
Cholesterol: 186 mg/dL (ref <200)
HDL: 55 mg/dL (ref >=50)
LDL Cholesterol (Calc): 114 mg/dL (calc) — ABNORMAL HIGH
Non-HDL Cholesterol (Calc): 131 mg/dL (calc) — ABNORMAL HIGH (ref <130)
Total CHOL/HDL Ratio: 3.4 (calc) (ref <5.0)
Triglycerides: 80 mg/dL (ref <150)

## 2022-08-02 LAB — COMPLETE METABOLIC PANEL WITH GFR
AG Ratio: 1.3 (calc) (ref 1.0–2.5)
ALT: 21 U/L (ref 6–29)
AST: 20 U/L (ref 10–30)
Albumin: 4.1 g/dL (ref 3.6–5.1)
Alkaline phosphatase (APISO): 62 U/L (ref 31–125)
BUN: 8 mg/dL (ref 7–25)
CO2: 26 mmol/L (ref 20–32)
Calcium: 9.1 mg/dL (ref 8.6–10.2)
Chloride: 105 mmol/L (ref 98–110)
Creat: 0.65 mg/dL (ref 0.50–0.96)
Globulin: 3.1 g/dL (calc) (ref 1.9–3.7)
Glucose, Bld: 145 mg/dL — ABNORMAL HIGH (ref 65–99)
Potassium: 4.2 mmol/L (ref 3.5–5.3)
Sodium: 139 mmol/L (ref 135–146)
Total Bilirubin: 0.3 mg/dL (ref 0.2–1.2)
Total Protein: 7.2 g/dL (ref 6.1–8.1)
eGFR: 129 mL/min/{1.73_m2} (ref >=60)

## 2022-08-02 LAB — MICROALBUMIN / CREATININE URINE RATIO
Creatinine, Urine: 123 mg/dL (ref 20–275)
Microalb Creat Ratio: 14 mcg/mg creat (ref <30)
Microalb, Ur: 1.7 mg/dL

## 2022-08-02 LAB — TSH: TSH: 1.25 mIU/L

## 2022-08-02 LAB — T4, FREE: Free T4: 1.1 ng/dL (ref 0.8–1.4)

## 2022-08-14 ENCOUNTER — Encounter (INDEPENDENT_AMBULATORY_CARE_PROVIDER_SITE_OTHER): Payer: Self-pay

## 2022-08-15 ENCOUNTER — Other Ambulatory Visit (INDEPENDENT_AMBULATORY_CARE_PROVIDER_SITE_OTHER): Payer: Self-pay | Admitting: Family

## 2022-08-15 MED ORDER — BASAGLAR KWIKPEN 100 UNIT/ML ~~LOC~~ SOPN: PEN_INJECTOR | SUBCUTANEOUS | 4 refills | Status: DC

## 2022-09-09 ENCOUNTER — Ambulatory Visit: Payer: 59 | Admitting: Adult Health

## 2022-09-15 ENCOUNTER — Other Ambulatory Visit: Payer: Self-pay | Admitting: Adult Health

## 2022-09-15 ENCOUNTER — Other Ambulatory Visit (INDEPENDENT_AMBULATORY_CARE_PROVIDER_SITE_OTHER): Payer: Self-pay | Admitting: Family

## 2022-10-31 ENCOUNTER — Ambulatory Visit (INDEPENDENT_AMBULATORY_CARE_PROVIDER_SITE_OTHER): Payer: 59 | Admitting: Family

## 2022-11-03 ENCOUNTER — Encounter: Payer: Self-pay | Admitting: Adult Health

## 2022-11-03 ENCOUNTER — Other Ambulatory Visit (INDEPENDENT_AMBULATORY_CARE_PROVIDER_SITE_OTHER): Payer: Self-pay | Admitting: Family

## 2022-11-03 ENCOUNTER — Ambulatory Visit: Payer: 59 | Admitting: Adult Health

## 2022-11-03 VITALS — BP 134/86 | HR 98 | Ht 64.0 in | Wt 234.0 lb

## 2022-11-03 DIAGNOSIS — Z3202 Encounter for pregnancy test, result negative: Secondary | ICD-10-CM

## 2022-11-03 DIAGNOSIS — R69 Illness, unspecified: Secondary | ICD-10-CM | POA: Diagnosis not present

## 2022-11-03 DIAGNOSIS — F419 Anxiety disorder, unspecified: Secondary | ICD-10-CM | POA: Diagnosis not present

## 2022-11-03 DIAGNOSIS — F32A Depression, unspecified: Secondary | ICD-10-CM

## 2022-11-03 LAB — POCT URINE PREGNANCY: Preg Test, Ur: NEGATIVE

## 2022-11-03 MED ORDER — HYDROXYZINE HCL 10 MG PO TABS: 10.0000 mg | ORAL_TABLET | Freq: Three times a day (TID) | ORAL | 0 refills | Status: DC | PRN

## 2022-11-03 MED ORDER — ESCITALOPRAM OXALATE 20 MG PO TABS: 20.0000 mg | ORAL_TABLET | Freq: Every day | ORAL | 6 refills | Status: DC

## 2022-11-03 NOTE — Progress Notes (Signed)
Subjective:     Patient ID: Krista Wang, female   DOB: [birth year], 21 y.o.   MRN: [id number]  HPI Krista Wang is a 21 year old black female,single, G0P0, back in follow up on taking lexapro and is better but thinks it needs increasing, and Vistarill 25 mg makes her too sleepy.   Review of Systems Anxiety and depression is better Had bleeding with lo loestrin for 3 weeks last month and no period yet in December  Reviewed past medical,surgical, social and family history. Reviewed medications and allergies.     Objective:   Physical Exam BP 134/86 (BP Location: Right Arm, Patient Position: Sitting, Cuff Size: Large)   Pulse 98   Ht 5\' 4"  (1.626 m)   Wt 234 lb (106.1 kg)   LMP 10/02/2022 (Approximate)   BMI 40.17 kg/m  UPT is negative.   Skin warm and dry.  Lungs: clear to ausculation bilaterally. Cardiovascular: regular rate and rhythm.  Fall risk is low    11/03/2022    1:47 PM 06/18/2022    8:32 AM 05/26/2022    3:21 PM  Depression screen PHQ 2/9  Decreased Interest 1 1 1   Down, Depressed, Hopeless 3 1 2   PHQ - 2 Score 4 2 3   Altered sleeping 2 3 2   Tired, decreased energy 1 3 2   Change in appetite 3 3 1   Feeling bad or failure about yourself  1 1 1   Trouble concentrating 2 3 0  Moving slowly or fidgety/restless 0 1 0  Suicidal thoughts 0 0 0  PHQ-9 Score 13 16 9   Difficult doing work/chores Somewhat difficult         11/03/2022    1:49 PM 06/18/2022    8:34 AM 05/26/2022    3:22 PM  GAD 7 : Generalized Anxiety Score  Nervous, Anxious, on Edge 3 3 3   Control/stop worrying 2 3 1   Worry too much - different things 2 3 1   Trouble relaxing 3 3 1   Restless 1 3 1   Easily annoyed or irritable 3 3 2   Afraid - awful might happen 2 3 1   Total GAD 7 Score 16 21 10   Anxiety Difficulty Very difficult        Upstream - 11/03/22 1345       Pregnancy Intention Screening   Does the patient want to become pregnant in the next year? No    Does the patient's partner want to become  pregnant in the next year? No    Would the patient like to discuss contraceptive options today? Yes      Contraception Wrap Up   Current Method Oral Contraceptive    End Method Oral Contraceptive             Assessment:     1. Pregnancy examination or test, negative result  2. Anxiety and depression Will increase lexapro to 20 mg daily and decrease vistaril to 10 mg prn Meds ordered this encounter  Medications   escitalopram (LEXAPRO) 20 MG tablet    Sig: Take 1 tablet (20 mg total) by mouth daily.    Dispense:  30 tablet    Refill:  6    Order Specific Question:   Supervising Provider    Answer:   Tania Ade H [[id number]]   hydrOXYzine (ATARAX) 10 MG tablet    Sig: Take 1 tablet (10 mg total) by mouth 3 (three) times daily as needed.    Dispense:  30 tablet    Refill:  0  Order Specific Question:   Supervising Provider    Answer:   Lazaro Arms [[id number]]       Plan:      Follow up in 6 weeks for ROS and first pap and physical

## 2022-11-24 ENCOUNTER — Encounter (INDEPENDENT_AMBULATORY_CARE_PROVIDER_SITE_OTHER): Payer: Self-pay

## 2022-11-29 ENCOUNTER — Other Ambulatory Visit (INDEPENDENT_AMBULATORY_CARE_PROVIDER_SITE_OTHER): Payer: Self-pay | Admitting: Family

## 2022-12-03 ENCOUNTER — Other Ambulatory Visit (INDEPENDENT_AMBULATORY_CARE_PROVIDER_SITE_OTHER): Payer: Self-pay | Admitting: Family

## 2022-12-16 ENCOUNTER — Ambulatory Visit: Payer: 59 | Admitting: Adult Health

## 2023-01-04 ENCOUNTER — Other Ambulatory Visit (INDEPENDENT_AMBULATORY_CARE_PROVIDER_SITE_OTHER): Payer: Self-pay | Admitting: Family

## 2023-01-04 DIAGNOSIS — E1065 Type 1 diabetes mellitus with hyperglycemia: Secondary | ICD-10-CM

## 2023-01-14 ENCOUNTER — Other Ambulatory Visit (INDEPENDENT_AMBULATORY_CARE_PROVIDER_SITE_OTHER): Payer: Self-pay | Admitting: Family

## 2023-01-15 ENCOUNTER — Encounter (INDEPENDENT_AMBULATORY_CARE_PROVIDER_SITE_OTHER): Payer: Self-pay | Admitting: Family

## 2023-01-15 ENCOUNTER — Ambulatory Visit (INDEPENDENT_AMBULATORY_CARE_PROVIDER_SITE_OTHER): Payer: 59 | Admitting: Family

## 2023-01-15 VITALS — BP 128/88 | HR 98 | Wt 237.8 lb

## 2023-01-15 DIAGNOSIS — E1065 Type 1 diabetes mellitus with hyperglycemia: Secondary | ICD-10-CM

## 2023-01-15 DIAGNOSIS — L83 Acanthosis nigricans: Secondary | ICD-10-CM

## 2023-01-15 DIAGNOSIS — E669 Obesity, unspecified: Secondary | ICD-10-CM

## 2023-01-15 DIAGNOSIS — Z794 Long term (current) use of insulin: Secondary | ICD-10-CM

## 2023-01-15 DIAGNOSIS — I10 Essential (primary) hypertension: Secondary | ICD-10-CM

## 2023-01-15 LAB — POCT GLUCOSE (DEVICE FOR HOME USE): POC Glucose: 168 mg/dl — AB (ref 70–99)

## 2023-01-15 LAB — POCT GLYCOSYLATED HEMOGLOBIN (HGB A1C): Hemoglobin A1C: 9.1 % — AB (ref 4.0–5.6)

## 2023-01-15 NOTE — Progress Notes (Signed)
Pediatric Endocrinology Diabetes Consultation Follow-up Visit  Krista Wang May 14, 2001 [id number]  Chief Complaint: Follow-up type 1 diabetes   Pcp, No   HPI: Krista Wang  is a 22 y.o. female presenting for follow-up of type 1 diabetes. she is accompanied to this visit by her mother and father.  1. Krista Wang noted about two years ago that her urine was thick like juice. She saw her PCP and had elevated urine glucose. She went to [facility] where her HbA1c was 11%. She was diagnosed with T2DM, started on metformin, 500 mg, twice daily, and followed at [facility] until about 9 months ago. During that time her weight had continued to increase to apeak weight of 223 pounds in march 2017. Her HbA1c had decreased to about 6% as of her last visit.  Her weight has decreased from 217 pounds in May 2017 to 189 now. She has been trying to lose weight.                          About two weeks ago Krista Wang became aware that she was urinating more, had nocturia, and was drinking more. She also developed nausea, upset stomach severe enough to cause her not to want to eat, and abdominal pains. Her vision has been normal. She has lost 31 pounds since May. She presented to clinic with a A1c of >14 and urine ketones. She was admitted to the [facility] and started on two component method using Lantus and Novolog.   She had elevated insulin antibody with normal GAD and negative pancreatic islet cell antibody.   2. Since discharge from hospital on 07/2022, Krista Wang has been doing well. No Hospitalizations.   Reports being very stressed out between work and school, school has been going well. She is exercising a couple days per week going to the gym.   She had a period of hyperglycemia when appears to have been due to bad insulin. She denies missed insulin doses. Estimates taking 9-10 units for meals which covers around 40 grams of carbs plus her blood sugar. Wears Dexcom CGM most of the  time. Hypoglycemia is rare.   Taking Lexapro 20 mg once daily, she followed with OBGYN. Has hydroxyzine for anxiety attacks, she uses it rarely. She goes to a behavioral health therapist once per week.   Concerns:  - Blood sugars run high after lunch.  - Reports she has a "lump" on her lower right side of abdomen. She has discussed with her OBGYN in December and reports they thought it may be an ovarian cyst. No discharge or drainage, non painful. Has regular menstrual cycles. She is going back to [facility] for follow up soon. Declines for me to examine it today. She denies giving insulin injections in that area.   Insulin regimen: 47 units of Lantus. Novolog 120/30/6 plan  Hypoglycemia: Able to feel low blood sugars.  No glucagon needed recently.  Blood glucose download:  Dexcom CGM:   - Pattern of hyperglycemia overnight and after lunch.   Med-alert ID: Not currently wearing. Injection sites: Arms, legs and abdomen  Annual labs due: 07/2023 Ophthalmology due: 08/2022. Discussed with family today. Has appointment set up     3. ROS: Greater than 10 systems reviewed with pertinent positives listed in HPI, otherwise neg. Review of Systems  Constitutional:. Sleeping well. 3 lbs weight gain  HENT: Negative.   Eyes: Negative for blurred vision and pain.  Respiratory: Negative for cough and shortness  of breath.   Cardiovascular: Negative for chest pain and palpitations.  Gastrointestinal: Negative for abdominal pain, constipation, diarrhea and heartburn.  Genitourinary: Negative for frequency and urgency.  Musculoskeletal: Negative for neck pain.  Skin: Negative for itching and rash.  Neurological: Negative for dizziness, tremors, sensory change and weakness.  Endo/Heme/Allergies: Negative for polydipsia.  Psychiatric/Behavioral: Negative for depression. The patient is not nervous/anxious.      Past Medical History:   Past Medical History:  Diagnosis Date   Allergy    Asthma     Diabetes mellitus without complication ([place])    type 1   Gallstones    Hypertension    Evaluated by [facility] in 2019, did not follow up or have an appt in July 2019 per Nephrology's recs   Impacted ear wax 12/21/2015   Irregular menses    Nocturnal enuresis 07/01/2013   Obesity    Unspecified constipation 07/07/2013   Weight gain 07/07/2013    Medications:  Outpatient Encounter Medications as of 01/15/2023  Medication Sig Note   Accu-Chek FastClix Lancets MISC USE 1 LANCET TO CHECK GLUCOSE SIX TIMES DAILY    ACCU-CHEK GUIDE test strip USE TO CHECK GLUCOSE 6 TIMES DAILY    escitalopram (LEXAPRO) 20 MG tablet Take 1 tablet (20 mg total) by mouth daily.    Glucagon (BAQSIMI ONE PACK) 3 MG/DOSE POWD PLACE 1 DOSE INTO THE NOSE AS NEEDED    hydrOXYzine (ATARAX) 10 MG tablet Take 1 tablet (10 mg total) by mouth 3 (three) times daily as needed.    Insulin Glargine (BASAGLAR KWIKPEN) 100 UNIT/ML Inject up to 50 units per day    Insulin Pen Needle (BD PEN NEEDLE NANO 2ND GEN) 32G X 4 MM MISC USE 1 PEN NEEDLE  SIX TIMES DAILY TO  INJECT  INSULIN    NOVOLOG FLEXPEN 100 UNIT/ML FlexPen INJECT UP TO 50 UNITS SUBCUTANEOUSLY ONCE DAILY AS DIRECTED BY  MD    albuterol (PROVENTIL HFA;VENTOLIN HFA) 108 (90 Base) MCG/ACT inhaler 2 puffs before exercise or every 4 to 6 hours as needed for cough or wheezing (Patient not taking: Reported on 08/01/2022) 11/03/2022: Needs refill   Continuous Blood Gluc Sensor (DEXCOM G7 SENSOR) MISC CHANGE SENSOR EVERY 10 DAYS (Patient not taking: Reported on 01/15/2023)    Norethindrone-Ethinyl Estradiol-Fe Biphas (LO LOESTRIN FE) 1 MG-10 MCG / 10 MCG tablet Take 1 tablet by mouth daily. Take 1 daily by mouth (Patient not taking: Reported on 01/15/2023)    No facility-administered encounter medications on file as of 01/15/2023.    Allergies: Allergies  Allergen Reactions   Blue Dyes (Parenteral) Swelling    Surgical History: Past Surgical History:  Procedure Laterality Date    CHOLECYSTECTOMY N/A 04/03/2021   Procedure: LAPAROSCOPIC CHOLECYSTECTOMY;  Surgeon: Virl Cagey, MD;  Location: [facility];  Service: General;  Laterality: N/A;   TONSILLECTOMY     WISDOM TOOTH EXTRACTION      Family History:  Family History  Problem Relation Age of Onset   Diabetes Mother    Hypertension Mother    Diabetes Father    Hypertension Father    Hypertension Maternal Grandmother    Stroke Maternal Grandmother    Diabetes Maternal Grandmother    Thyroid disease Maternal Grandmother    Heart attack Maternal Grandfather    Diabetes Paternal Grandmother    Hypertension Paternal Grandmother    Dementia Paternal Grandmother    Kidney failure Paternal Grandfather    Hypertension Paternal Grandfather    Diabetes Paternal  Grandfather    Thyroid disease Maternal Aunt       Social History: Lives with: Dorm at college.  Junior at [employer]   Physical Exam:  Vitals:   01/15/23 0820  BP: 128/88  Pulse: 98  Weight: 237 lb 12.8 oz (107.9 kg)       BP 128/88   Pulse 98   Wt 237 lb 12.8 oz (107.9 kg)   BMI 40.82 kg/m  Body mass index: body mass index is 40.82 kg/m. Growth %ile SmartLinks can only be used for patients less than 6 years old.  Ht Readings from Last 3 Encounters:  11/03/22 '5\' 4"'$  (1.626 m)  06/18/22 '5\' 4"'$  (1.626 m)  06/09/22 '5\' 4"'$  (1.626 m)   Wt Readings from Last 3 Encounters:  01/15/23 237 lb 12.8 oz (107.9 kg)  11/03/22 234 lb (106.1 kg)  08/01/22 220 lb 9.6 oz (100.1 kg)    Physical Exam   General: Well developed, well nourished female in no acute distress.   Head: Normocephalic, atraumatic.   Eyes:  Pupils equal and round. EOMI.   Sclera white.  No eye drainage.   Ears/Nose/Mouth/Throat: Nares patent, no nasal drainage.  Normal dentition, mucous membranes moist.   Neck: supple, no cervical lymphadenopathy, no thyromegaly Cardiovascular: regular rate, normal S1/S2, no murmurs Respiratory: No increased work of breathing.  Lungs clear to  auscultation bilaterally.  No wheezes. Abdomen: soft, nontender, nondistended. No appreciable masses  Extremities: warm, well perfused, cap refill < 2 sec.   Musculoskeletal: Normal muscle mass.  Normal strength Skin: warm, dry.  No rash or lesions. + acanthosis  Neurologic: alert and oriented, normal speech, no tremor   Labs:  Last Hemoglobin A1c: 7.0% on 10/2021  Results for orders placed or performed in visit on 01/15/23  POCT glycosylated hemoglobin (Hb A1C)  Result Value Ref Range   Hemoglobin A1C 9.1 (A) 4.0 - 5.6 %   HbA1c POC (<> result, manual entry)     HbA1c, POC (prediabetic range)     HbA1c, POC (controlled diabetic range)    POCT Glucose (Device for Home Use)  Result Value Ref Range   Glucose Fasting, POC     POC Glucose 168 (A) 70 - 99 mg/dl    Assessment/Plan: Krista Wang is a 22 y.o. female with type 1 diabetes on MDI and Dexcom CGM. Has a pattern of hyperglycemia overnight and after lunch, will adjust insulin doses. Hemoglobin A1c is 9.1% today which is higher then ADA goal of <7%. Advised that she should follow up with OBGYN to address the "lump" on lower abdomen since she declined examination today.     1. DM w/o complication type I, uncontrolled (HCC)/hypergylcemia/ - Reviewed meter and CGM download. Discussed trends and patterns.  - Rotate injection sites to prevent scar tissue.  - bolus 15 minutes prior to eating to limit blood sugar spikes.  - Reviewed carb counting and importance of accurate carb counting.  - Discussed signs and symptoms of hypoglycemia. Always have glucose available.  - POCT glucose and hemoglobin A1c  - Reviewed growth chart.  - Discussed increased insulin need with stress.   2. Insulin dose change  - Increase Lantus to 49 units. After 3 days, if still waking up over 150, increase 1-2 more units.  - Start Novolog 1 unit for every 5 grams of carbs.   3. Hypertension.  Follow with nephrology as needed.     Follow-up:   3 month.  Mychart message with blood sugars as needed for  adjustments.   LOS: >40  spent today reviewing the medical chart, counseling the patient/family, and documenting today's visit.     Hermenia Bers,  FNP-C  Pediatric Specialist  [address] [place]  [place], [address]  Tele: [phone]

## 2023-01-15 NOTE — Patient Instructions (Signed)
-   Increase Lantus to 49 units. After 3 days, if still waking up over 150, increase 1-2 more units.  - Start Novolog 1 unit for every 5 grams of carbs.   It was a pleasure seeing you in clinic today. Please do not hesitate to contact me if you have questions or concerns.   Please sign up for MyChart. This is a communication tool that allows you to send an email directly to me. This can be used for questions, prescriptions and blood sugar reports. We will also release labs to you with instructions on MyChart. Please do not use MyChart if you need immediate or emergency assistance. Ask our wonderful front office staff if you need assistance.

## 2023-01-17 ENCOUNTER — Encounter: Payer: Self-pay | Admitting: Women's Health

## 2023-01-17 ENCOUNTER — Encounter (INDEPENDENT_AMBULATORY_CARE_PROVIDER_SITE_OTHER): Payer: Self-pay

## 2023-01-19 ENCOUNTER — Ambulatory Visit: Payer: 59 | Admitting: Women's Health

## 2023-01-30 ENCOUNTER — Encounter (HOSPITAL_COMMUNITY): Payer: Self-pay

## 2023-01-30 ENCOUNTER — Emergency Department (HOSPITAL_COMMUNITY): Payer: 59

## 2023-01-30 ENCOUNTER — Other Ambulatory Visit: Payer: Self-pay

## 2023-01-30 ENCOUNTER — Emergency Department (HOSPITAL_COMMUNITY)
Admission: EM | Admit: 2023-01-30 | Discharge: 2023-01-31 | Disposition: A | Discharge status: Home / Self Care | Payer: 59 | Attending: Emergency Medicine | Admitting: Emergency Medicine

## 2023-01-30 DIAGNOSIS — I10 Essential (primary) hypertension: Secondary | ICD-10-CM | POA: Diagnosis not present

## 2023-01-30 DIAGNOSIS — E109 Type 1 diabetes mellitus without complications: Secondary | ICD-10-CM | POA: Diagnosis not present

## 2023-01-30 DIAGNOSIS — Z794 Long term (current) use of insulin: Secondary | ICD-10-CM | POA: Diagnosis not present

## 2023-01-30 DIAGNOSIS — R1030 Lower abdominal pain, unspecified: Secondary | ICD-10-CM | POA: Insufficient documentation

## 2023-01-30 DIAGNOSIS — R1031 Right lower quadrant pain: Secondary | ICD-10-CM | POA: Diagnosis not present

## 2023-01-30 DIAGNOSIS — Z79899 Other long term (current) drug therapy: Secondary | ICD-10-CM | POA: Insufficient documentation

## 2023-01-30 DIAGNOSIS — K760 Fatty (change of) liver, not elsewhere classified: Secondary | ICD-10-CM | POA: Diagnosis not present

## 2023-01-30 DIAGNOSIS — R109 Unspecified abdominal pain: Secondary | ICD-10-CM | POA: Diagnosis not present

## 2023-01-30 DIAGNOSIS — R102 Pelvic and perineal pain: Secondary | ICD-10-CM | POA: Diagnosis not present

## 2023-01-30 LAB — CBC WITH DIFFERENTIAL/PLATELET
Abs Immature Granulocytes: 0.02 10*3/uL (ref 0.00–0.07)
Basophils Absolute: 0.1 10*3/uL (ref 0.0–0.1)
Basophils Relative: 1 %
Eosinophils Absolute: 0.2 10*3/uL (ref 0.0–0.5)
Eosinophils Relative: 3 %
HCT: 41 % (ref 36.0–46.0)
Hemoglobin: 12.8 g/dL (ref 12.0–15.0)
Immature Granulocytes: 0 %
Lymphocytes Relative: 22 %
Lymphs Abs: 1.7 10*3/uL (ref 0.7–4.0)
MCH: 27.2 pg (ref 26.0–34.0)
MCHC: 31.2 g/dL (ref 30.0–36.0)
MCV: 87.2 fL (ref 80.0–100.0)
Monocytes Absolute: 0.7 10*3/uL (ref 0.1–1.0)
Monocytes Relative: 9 %
Neutro Abs: 5 10*3/uL (ref 1.7–7.7)
Neutrophils Relative %: 65 %
Platelets: 323 10*3/uL (ref 150–400)
RBC: 4.7 MIL/uL (ref 3.87–5.11)
RDW: 12.7 % (ref 11.5–15.5)
WBC: 7.6 10*3/uL (ref 4.0–10.5)
nRBC: 0 % (ref 0.0–0.2)

## 2023-01-30 LAB — COMPREHENSIVE METABOLIC PANEL
ALT: 29 U/L (ref 0–44)
AST: 27 U/L (ref 15–41)
Albumin: 4 g/dL (ref 3.5–5.0)
Alkaline Phosphatase: 60 U/L (ref 38–126)
Anion gap: 6 (ref 5–15)
BUN: 8 mg/dL (ref 6–20)
CO2: 24 mmol/L (ref 22–32)
Calcium: 8.6 mg/dL — ABNORMAL LOW (ref 8.9–10.3)
Chloride: 103 mmol/L (ref 98–111)
Creatinine, Ser: 0.42 mg/dL — ABNORMAL LOW (ref 0.44–1.00)
GFR, Estimated: >60 mL/min (ref >60)
Glucose, Bld: 239 mg/dL — ABNORMAL HIGH (ref 70–99)
Potassium: 3.8 mmol/L (ref 3.5–5.1)
Sodium: 133 mmol/L — ABNORMAL LOW (ref 135–145)
Total Bilirubin: 0.6 mg/dL (ref 0.3–1.2)
Total Protein: 7.9 g/dL (ref 6.5–8.1)

## 2023-01-30 LAB — I-STAT BETA HCG BLOOD, ED (MC, WL, AP ONLY): I-stat hCG, quantitative: <5 m[IU]/mL (ref <5)

## 2023-01-30 MED ORDER — IOHEXOL 300 MG/ML  SOLN
100.0000 mL | Freq: Once | INTRAMUSCULAR | Status: AC | PRN
Start: 2023-01-30 — End: 2023-01-30
  Administered 2023-01-30: 100 mL via INTRAVENOUS

## 2023-01-30 MED ORDER — SODIUM CHLORIDE (PF) 0.9 % IJ SOLN
INTRAMUSCULAR | Status: AC
  Filled 2023-01-30: qty 50

## 2023-01-30 NOTE — ED Triage Notes (Addendum)
Patient reports pelvic pain and two bumps on her lower abdomen. States this has been going on for a few months and was seen by gyn in December and was told everything looked normal. States pain got worse last night and it has been intermittent since. States bumps on stomach are purple. Denies any injury.

## 2023-01-30 NOTE — ED Provider Triage Note (Signed)
Emergency Medicine Provider Triage Evaluation Note  Krista Wang , a 22 y.o. female  was evaluated in triage.  Pt complains of pelvic pain, no discharge.  Sexually active with men, irregular periods been off birth control recently.  She is also having 2 bruises to either side of her lower abdomen which is been there for over a month and she is concerned about that..  Review of Systems  Per HPI  Physical Exam  BP (!) 173/109 (BP Location: Right Arm)   Pulse (!) 117   Temp 98.1 F (36.7 C) (Oral)   Resp 18   Ht 5\' 4"  (1.626 m)   Wt 107.5 kg   SpO2 99%   BMI 40.68 kg/m  Gen:   Awake, no distress   Resp:  Normal effort  MSK:   Moves extremities without difficulty  Other:  Abdomen nontender  Medical Decision Making  Medically screening exam initiated at 9:35 PM.  Appropriate orders placed.  [name] was informed that the remainder of the evaluation will be completed by another provider, this initial triage assessment does not replace that evaluation, and the importance of remaining in the ED until their evaluation is complete.     Sherrill Raring, PA-C 01/30/23 2136

## 2023-01-31 DIAGNOSIS — K760 Fatty (change of) liver, not elsewhere classified: Secondary | ICD-10-CM | POA: Diagnosis not present

## 2023-01-31 DIAGNOSIS — R109 Unspecified abdominal pain: Secondary | ICD-10-CM | POA: Diagnosis not present

## 2023-01-31 LAB — URINALYSIS, ROUTINE W REFLEX MICROSCOPIC
Bilirubin Urine: NEGATIVE
Glucose, UA: 50 mg/dL — AB
Hgb urine dipstick: NEGATIVE
Ketones, ur: 5 mg/dL — AB
Leukocytes,Ua: NEGATIVE
Nitrite: NEGATIVE
Protein, ur: NEGATIVE mg/dL
Specific Gravity, Urine: 1.009 (ref 1.005–1.030)
pH: 7 (ref 5.0–8.0)

## 2023-01-31 NOTE — Discharge Instructions (Signed)
Lab work imaging was reassuring please continue with all your medications  Follow-up with your primary care doctor and/or OB/GYN for further assessment.  Come back to the emergency department if you develop chest pain, shortness of breath, severe abdominal pain, uncontrolled nausea, vomiting, diarrhea.

## 2023-01-31 NOTE — ED Provider Notes (Signed)
[place] EMERGENCY DEPARTMENT AT [facility] Provider Note   CSN: [id number] Arrival date & time: 01/30/23  2111     History  Chief Complaint  Patient presents with   Pelvic Pain    Krista Wang is a 22 y.o. female.  HPI   Patient with medical history including hypertension, type 1 diabetes, presenting with complaints of pelvic pain as well as a bump on her abdomen, patient states that she been having pelvic pain for years, states the pain comes and goes, states she will feel her lower pelvic region, generally worsened to her menstrual cycle, currently not on birth control, last menstrual cycle was last month, she denies any vaginal discharge vaginal bleeding denies any urinary symptoms, no history of ectopic pregnancies or ovarian torsion's.  Patient has chronic nausea but without any vomiting, still passing gas having normal bowel movements no abdominal surgeries.  Patient states that she noticed a bump or a discoloration on her abdomen a few days ago and she just wanted to be checked out.  Home Medications Prior to Admission medications   Medication Sig Start Date End Date Taking? Authorizing Provider  Accu-Chek FastClix Lancets MISC USE 1 LANCET TO CHECK GLUCOSE SIX TIMES DAILY 11/07/19   Levon Hedger, MD  ACCU-CHEK GUIDE test strip USE TO CHECK GLUCOSE 6 TIMES DAILY 04/26/20   Hermenia Bers, NP  albuterol (PROVENTIL HFA;VENTOLIN HFA) 108 (90 Base) MCG/ACT inhaler 2 puffs before exercise or every 4 to 6 hours as needed for cough or wheezing Patient not taking: Reported on 08/01/2022 08/18/17   Fransisca Connors, MD  Continuous Blood Gluc Sensor (DEXCOM G7 SENSOR) MISC CHANGE SENSOR EVERY 10 DAYS Patient not taking: Reported on 01/15/2023 01/05/23   Hermenia Bers, NP  escitalopram (LEXAPRO) 20 MG tablet Take 1 tablet (20 mg total) by mouth daily. 11/03/22   Estill Dooms, NP  Glucagon (BAQSIMI ONE PACK) 3 MG/DOSE POWD PLACE 1 DOSE INTO THE NOSE AS  NEEDED 10/29/21   Hermenia Bers, NP  hydrOXYzine (ATARAX) 10 MG tablet Take 1 tablet (10 mg total) by mouth 3 (three) times daily as needed. 11/03/22   Estill Dooms, NP  Insulin Glargine [facility]) 100 UNIT/ML Inject up to 50 units per day 08/15/22   Hermenia Bers, NP  Insulin Pen Needle (BD PEN NEEDLE NANO 2ND GEN) 32G X 4 MM MISC USE 1 PEN NEEDLE  SIX TIMES DAILY TO  INJECT  INSULIN 01/05/23   Hermenia Bers, NP  Norethindrone-Ethinyl Estradiol-Fe Biphas (LO LOESTRIN FE) 1 MG-10 MCG / 10 MCG tablet Take 1 tablet by mouth daily. Take 1 daily by mouth Patient not taking: Reported on 01/15/2023 05/26/22   Estill Dooms, NP  NOVOLOG FLEXPEN 100 UNIT/ML FlexPen INJECT UP TO 50 UNITS SUBCUTANEOUSLY ONCE DAILY AS DIRECTED BY  MD 01/14/23   Hermenia Bers, NP      Allergies    Blue dyes (parenteral)    Review of Systems   Review of Systems  Constitutional:  Negative for chills and fever.  Respiratory:  Negative for shortness of breath.   Cardiovascular:  Negative for chest pain.  Gastrointestinal:  Negative for abdominal pain.  Genitourinary:  Positive for pelvic pain.  Neurological:  Negative for headaches.    Physical Exam Updated Vital Signs BP (!) 151/85   Pulse (!) 114   Temp 98.1 F (36.7 C)   Resp 18   Ht 5\' 4"  (1.626 m)   Wt 107.5 kg   SpO2 99%  BMI 40.68 kg/m  Physical Exam Vitals and nursing note reviewed.  Constitutional:      General: She is not in acute distress.    Appearance: She is not ill-appearing.  HENT:     Head: Normocephalic and atraumatic.     Nose: No congestion.  Eyes:     Conjunctiva/sclera: Conjunctivae normal.  Cardiovascular:     Rate and Rhythm: Normal rate and regular rhythm.     Pulses: Normal pulses.     Heart sounds: No murmur heard.    No friction rub. No gallop.  Pulmonary:     Effort: No respiratory distress.     Breath sounds: No wheezing, rhonchi or rales.  Abdominal:     Palpations: Abdomen is soft.      Tenderness: There is abdominal tenderness. There is no right CVA tenderness or left CVA tenderness.     Comments: Abdomen nondistended, soft, she has a slight discoloration on the right lower quadrant, no palpable mass, abdomen is slightly tender in the right lower quadrant without guarding rebound tenderness or peritoneal sign, no flank tenderness no CVA tenderness.  Skin:    General: Skin is warm and dry.  Neurological:     Mental Status: She is alert.  Psychiatric:        Mood and Affect: Mood normal.     ED Results / Procedures / Treatments   Labs (all labs ordered are listed, but only abnormal results are displayed) Labs Reviewed  COMPREHENSIVE METABOLIC PANEL - Abnormal; Notable for the following components:      Result Value   Sodium 133 (*)    Glucose, Bld 239 (*)    Creatinine, Ser 0.42 (*)    Calcium 8.6 (*)    All other components within normal limits  URINALYSIS, ROUTINE W REFLEX MICROSCOPIC - Abnormal; Notable for the following components:   Color, Urine STRAW (*)    Glucose, UA 50 (*)    Ketones, ur 5 (*)    All other components within normal limits  CBC WITH DIFFERENTIAL/PLATELET  I-STAT BETA HCG BLOOD, ED ([facility], [facility], [facility] ONLY)    EKG None  Radiology CT ABDOMEN PELVIS W CONTRAST  Result Date: 01/31/2023 CLINICAL DATA:  Right lower quadrant abdominal pain EXAM: CT ABDOMEN AND PELVIS WITH CONTRAST TECHNIQUE: Multidetector CT imaging of the abdomen and pelvis was performed using the standard protocol following bolus administration of intravenous contrast. RADIATION DOSE REDUCTION: This exam was performed according to the departmental dose-optimization program which includes automated exposure control, adjustment of the mA and/or kV according to patient size and/or use of iterative reconstruction technique. CONTRAST:  135mL OMNIPAQUE IOHEXOL 300 MG/ML  SOLN COMPARISON:  None Available. FINDINGS: Lower chest: No acute abnormality. Hepatobiliary: Mild hepatic steatosis. No  enhancing intrahepatic mass. No intra or extrahepatic biliary ductal dilation. Status post cholecystectomy. Pancreas: Unremarkable Spleen: Unremarkable Adrenals/Urinary Tract: Adrenal glands are unremarkable. Kidneys are normal, without renal calculi, focal lesion, or hydronephrosis. Bladder is unremarkable. Stomach/Bowel: Stomach is within normal limits. Appendix appears normal. No evidence of bowel wall thickening, distention, or inflammatory changes. Vascular/Lymphatic: No significant vascular findings are present. No enlarged abdominal or pelvic lymph nodes. Reproductive: Uterus and bilateral adnexa are unremarkable. Other: No abdominal wall hernia or abnormality. No abdominopelvic ascites. Musculoskeletal: No acute or significant osseous findings. IMPRESSION: 1. No acute intra-abdominal pathology identified. No definite radiographic explanation for the patient's reported symptoms. Normal appendix. 2. Mild hepatic steatosis. 3. Status post cholecystectomy. Electronically Signed   By: Linwood Dibbles.D.  On: 01/31/2023 00:13    Procedures Procedures    Medications Ordered in ED Medications  sodium chloride (PF) 0.9 % injection (  Given 01/31/23 0028)  iohexol (OMNIPAQUE) 300 MG/ML solution 100 mL (100 mLs Intravenous Contrast Given 01/30/23 2355)    ED Course/ Medical Decision Making/ A&P                             Medical Decision Making Amount and/or Complexity of Data Reviewed Radiology: ordered.  Risk Prescription drug management.   This patient presents to the ED for concern of abdominal pain, this involves an extensive number of treatment options, and is a complaint that carries with it a high risk of complications and morbidity.  The differential diagnosis includes ectopic pregnancy, PID, appendicitis, ovarian torsion, diverticulitis, hernia    Additional history obtained:  Additional history obtained from N/A External records from outside source obtained and reviewed  including OB GYN notes   Co morbidities that complicate the patient evaluation  Diabetes  Social Determinants of Health:  N/A    Lab Tests:  I Ordered, and personally interpreted labs.  The pertinent results include: CBC is unremarkable, CMP reveals sodium of 133, glucose 239, creatinine 0.42, calcium 8.6, pregnancy negative, UA unremarkable   Imaging Studies ordered:  I ordered imaging studies including CT [facility] I independently visualized and interpreted imaging which showed negative acute findings I agree with the radiologist interpretation   Cardiac Monitoring:  The patient was maintained on a cardiac monitor.  I personally viewed and interpreted the cardiac monitored which showed an underlying rhythm of: N/A   Medicines ordered and prescription drug management:  I ordered medication including N/A I have reviewed the patients home medicines and have made adjustments as needed  Critical Interventions:  N/A   Reevaluation:  Presents with pelvic pain and concerns of bumps on her abdomen, patient has slight tenderness in her right lower quadrant, there is slight discoloration noted in that area, lab work is unremarkable, will further assess with a CT abdomen pelvis for rule out of possible hernia versus appendicitis, ovarian torsion.  Reassessed resting comfortably agreement with discharge at this time  Consultations Obtained:  N/A    Test Considered:  N/A    Rule out Suspicion for egg topic pregnancy is low at this time as urine pregnancy is negative.  I doubt ovarian torsion presentation is atypical, no large adnexal mass seen on CT imaging.  I doubt UTI [place] or kidney stone no urinary symptoms UA negative for signs infection or hematuria.  I doubt intra-abdominal mass, hernia, appendicitis, diverticulitis, volvulus, bowel obstruction as CT imaging is all negative for these findings.  I doubt PID nonnursing any vaginal discharge vaginal bleeding no urinary  symptoms also notes that she been having this pain for years, states that she really came in because she was more concerned about the discoloration on her abdomen.    Dispostion and problem list  After consideration of the diagnostic results and the patients response to treatment, I feel that the patent would benefit from discharge.  Abdominal pain-acute on chronic,  follow-up with her primary doctor and her OB/GYN for further evaluation and strict return precautions.            Final Clinical Impression(s) / ED Diagnoses Final diagnoses:  Lower abdominal pain    Rx / DC Orders ED Discharge Orders     None  Marcello Fennel, PA-C 01/31/23 Loura Pardon    Ripley Fraise, MD 01/31/23 [phone]

## 2023-02-02 ENCOUNTER — Ambulatory Visit: Payer: 59 | Admitting: Women's Health

## 2023-04-02 ENCOUNTER — Ambulatory Visit: Payer: 59 | Admitting: Nurse Practitioner

## 2023-04-14 ENCOUNTER — Ambulatory Visit: Payer: 59 | Admitting: Advanced Practice Midwife

## 2023-04-15 ENCOUNTER — Ambulatory Visit (INDEPENDENT_AMBULATORY_CARE_PROVIDER_SITE_OTHER): Payer: 59 | Admitting: Family

## 2023-05-13 DIAGNOSIS — E109 Type 1 diabetes mellitus without complications: Secondary | ICD-10-CM | POA: Diagnosis not present

## 2023-05-28 ENCOUNTER — Encounter (INDEPENDENT_AMBULATORY_CARE_PROVIDER_SITE_OTHER): Payer: Self-pay

## 2023-05-28 ENCOUNTER — Telehealth (INDEPENDENT_AMBULATORY_CARE_PROVIDER_SITE_OTHER): Payer: 59 | Admitting: Family

## 2023-05-28 NOTE — Progress Notes (Deleted)
Is the patient/family in a moving vehicle? If yes, please ask family to pull over and park in a safe place to continue the visit.  This is a Pediatric Specialist E-Visit consult/follow up provided via My Chart Video Visit (Caregility). Krista Wang and their parent/guardian *** (name of consenting adult) consented to an E-Visit consult today.  Is the patient present for the video visit? {Yes, No, Can't be seen virtually.:[id number]} Location of patient: Krista Wang is at *** (location) Is the patient located in the state of [place]? {Yes, No - patient cannot be seen virtually.:[id number]} Location of provider: Gretchen Short, DNP office Patient was referred by No ref. provider found   The following participants were involved in this E-Visit: Gretchen Short DNP, Tiffany Aundra Dubin, Krista Wang, Patient  This visit was done via VIDEO   Chief Complain/ Reason for E-Visit today: *** Total time on call: *** Follow up: ***         Pediatric Endocrinology Diabetes Consultation Follow-up Visit  Krista Wang 2001-07-01 [id number]  Chief Complaint: Follow-up type 1 diabetes   Pcp, No   HPI: Krista Wang  is a 22 y.o. female presenting for follow-up of type 1 diabetes. she is accompanied to this visit by her mother and father.  1. Krista Wang noted about two years ago that her urine was thick like juice. She saw her PCP and had elevated urine glucose. She went to [facility] where her HbA1c was 11%. She was diagnosed with T2DM, started on metformin, 500 mg, twice daily, and followed at [facility] until about 9 months ago. During that time her weight had continued to increase to apeak weight of 223 pounds in march 2017. Her HbA1c had decreased to about 6% as of her last visit.  Her weight has decreased from 217 pounds in May 2017 to 189 now. She has been trying to lose weight.                          About two weeks ago Krista Wang became aware that she was urinating more, had  nocturia, and was drinking more. She also developed nausea, upset stomach severe enough to cause her not to want to eat, and abdominal pains. Her vision has been normal. She has lost 31 pounds since May. She presented to clinic with a A1c of >14 and urine ketones. She was admitted to the [facility] and started on two component method using Lantus and Novolog.   She had elevated insulin antibody with normal GAD and negative pancreatic islet cell antibody.   2. Krista Wang was last seen in clinic on 02/2023, since that time she has been well.   Reports being very stressed out between work and school, school has been going well. She is exercising a couple days per week going to the gym.   She had a period of hyperglycemia when appears to have been due to bad insulin. She denies missed insulin doses. Estimates taking 9-10 units for meals which covers around 40 grams of carbs plus her blood sugar. Wears Dexcom CGM most of the time. Hypoglycemia is rare.   Taking Lexapro 20 mg once daily, she followed with OBGYN. Has hydroxyzine for anxiety attacks, she uses it rarely. She goes to a behavioral health therapist once per week.   Concerns:  - Blood sugars run high after lunch.  - Reports she has a "lump" on her lower right side of abdomen. She has discussed with her OBGYN  in December and reports they thought it may be an ovarian cyst. No discharge or drainage, non painful. Has regular menstrual cycles. She is going back to [facility] for follow up soon. Declines for me to examine it today. She denies giving insulin injections in that area.   Insulin regimen: 49 units of Lantus. Novolog 120/30/5 plan  Hypoglycemia: Able to feel low blood sugars.  No glucagon needed recently.  Blood glucose download:  Dexcom CGM:   - Pattern of hyperglycemia overnight and after lunch.   Med-alert ID: Not currently wearing. Injection sites: Arms, legs and abdomen  Annual labs due: 07/2023 Ophthalmology due:  08/2022. Discussed with family today. Has appointment set up     3. ROS: Greater than 10 systems reviewed with pertinent positives listed in HPI, otherwise neg. Review of Systems  Constitutional:. Sleeping well. 3 lbs weight gain  HENT: Negative.   Eyes: Negative for blurred vision and pain.  Respiratory: Negative for cough and shortness of breath.   Cardiovascular: Negative for chest pain and palpitations.  Gastrointestinal: Negative for abdominal pain, constipation, diarrhea and heartburn.  Genitourinary: Negative for frequency and urgency.  Musculoskeletal: Negative for neck pain.  Skin: Negative for itching and rash.  Neurological: Negative for dizziness, tremors, sensory change and weakness.  Endo/Heme/Allergies: Negative for polydipsia.  Psychiatric/Behavioral: Negative for depression. The patient is not nervous/anxious.      Past Medical History:   Past Medical History:  Diagnosis Date   Allergy    Asthma    Diabetes mellitus without complication (HCC)    type 1   Gallstones    Hypertension    Evaluated by [facility] in 2019, did not follow up or have an appt in July 2019 per Nephrology's recs   Impacted ear wax 12/21/2015   Irregular menses    Nocturnal enuresis 07/01/2013   Obesity    Unspecified constipation 07/07/2013   Weight gain 07/07/2013    Medications:  Outpatient Encounter Medications as of 05/28/2023  Medication Sig Note   Accu-Chek FastClix Lancets MISC USE 1 LANCET TO CHECK GLUCOSE SIX TIMES DAILY    ACCU-CHEK GUIDE test strip USE TO CHECK GLUCOSE 6 TIMES DAILY    albuterol (PROVENTIL HFA;VENTOLIN HFA) 108 (90 Base) MCG/ACT inhaler 2 puffs before exercise or every 4 to 6 hours as needed for cough or wheezing (Patient not taking: Reported on 08/01/2022) 11/03/2022: Needs refill   Continuous Blood Gluc Sensor (DEXCOM G7 SENSOR) MISC CHANGE SENSOR EVERY 10 DAYS (Patient not taking: Reported on 01/15/2023)    escitalopram (LEXAPRO) 20 MG tablet Take 1 tablet (20  mg total) by mouth daily.    Glucagon (BAQSIMI ONE PACK) 3 MG/DOSE POWD PLACE 1 DOSE INTO THE NOSE AS NEEDED    hydrOXYzine (ATARAX) 10 MG tablet Take 1 tablet (10 mg total) by mouth 3 (three) times daily as needed.    Insulin Glargine (BASAGLAR KWIKPEN) 100 UNIT/ML Inject up to 50 units per day    Insulin Pen Needle (BD PEN NEEDLE NANO 2ND GEN) 32G X 4 MM MISC USE 1 PEN NEEDLE  SIX TIMES DAILY TO  INJECT  INSULIN    Norethindrone-Ethinyl Estradiol-Fe Biphas (LO LOESTRIN FE) 1 MG-10 MCG / 10 MCG tablet Take 1 tablet by mouth daily. Take 1 daily by mouth (Patient not taking: Reported on 01/15/2023)    NOVOLOG FLEXPEN 100 UNIT/ML FlexPen INJECT UP TO 50 UNITS SUBCUTANEOUSLY ONCE DAILY AS DIRECTED BY  MD    No facility-administered encounter medications on file as of  05/28/2023.    Allergies: Allergies  Allergen Reactions   Blue Dyes (Parenteral) Swelling    Surgical History: Past Surgical History:  Procedure Laterality Date   CHOLECYSTECTOMY N/A 04/03/2021   Procedure: LAPAROSCOPIC CHOLECYSTECTOMY;  Surgeon: Lucretia Roers, MD;  Location: [facility];  Service: General;  Laterality: N/A;   TONSILLECTOMY     WISDOM TOOTH EXTRACTION      Family History:  Family History  Problem Relation Age of Onset   Diabetes Mother    Hypertension Mother    Diabetes Father    Hypertension Father    Hypertension Maternal Grandmother    Stroke Maternal Grandmother    Diabetes Maternal Grandmother    Thyroid disease Maternal Grandmother    Heart attack Maternal Grandfather    Diabetes Paternal Grandmother    Hypertension Paternal Grandmother    Dementia Paternal Grandmother    Kidney failure Paternal Grandfather    Hypertension Paternal Grandfather    Diabetes Paternal Grandfather    Thyroid disease Maternal Aunt       Social History: Lives with: Dorm at college.  Junior at [employer]   Physical Exam:  There were no vitals filed for this visit.      There were no vitals taken for this  visit. Body mass index: body mass index is unknown because there is no height or weight on file. Growth %ile SmartLinks can only be used for patients less than 72 years old.  Ht Readings from Last 3 Encounters:  01/30/23 5\' 4"  (1.626 m)  11/03/22 5\' 4"  (1.626 m)  06/18/22 5\' 4"  (1.626 m)   Wt Readings from Last 3 Encounters:  01/30/23 237 lb (107.5 kg)  01/15/23 237 lb 12.8 oz (107.9 kg)  11/03/22 234 lb (106.1 kg)    Physical Exam   General: Well developed, well nourished female in no acute distress.  Head: Normocephalic, atraumatic.   Cardiovascular: No cyanosis.  Respiratory: No increased work of breathing.   Skin: No rash or lesions. + acanthosis nigricans  Neurologic: alert and oriented, normal speech, no tremor   Labs:  Last Hemoglobin A1c: 7.0% on 10/2021  Results for orders placed or performed during the hospital encounter of 01/30/23  Comprehensive metabolic panel  Result Value Ref Range   Sodium 133 (L) 135 - 145 mmol/L   Potassium 3.8 3.5 - 5.1 mmol/L   Chloride 103 98 - 111 mmol/L   CO2 24 22 - 32 mmol/L   Glucose, Bld 239 (H) 70 - 99 mg/dL   BUN 8 6 - 20 mg/dL   Creatinine, Ser 1.61 (L) 0.44 - 1.00 mg/dL   Calcium 8.6 (L) 8.9 - 10.3 mg/dL   Total Protein 7.9 6.5 - 8.1 g/dL   Albumin 4.0 3.5 - 5.0 g/dL   AST 27 15 - 41 U/L   ALT 29 0 - 44 U/L   Alkaline Phosphatase 60 38 - 126 U/L   Total Bilirubin 0.6 0.3 - 1.2 mg/dL   GFR, Estimated >09 >60 mL/min   Anion gap 6 5 - 15  CBC with Differential  Result Value Ref Range   WBC 7.6 4.0 - 10.5 K/uL   RBC 4.70 3.87 - 5.11 MIL/uL   Hemoglobin 12.8 12.0 - 15.0 g/dL   HCT 45.4 09.8 - 11.9 %   MCV 87.2 80.0 - 100.0 fL   MCH 27.2 26.0 - 34.0 pg   MCHC 31.2 30.0 - 36.0 g/dL   RDW 14.7 82.9 - 56.2 %   Platelets 323 150 -  400 K/uL   nRBC 0.0 0.0 - 0.2 %   Neutrophils Relative % 65 %   Neutro Abs 5.0 1.7 - 7.7 K/uL   Lymphocytes Relative 22 %   Lymphs Abs 1.7 0.7 - 4.0 K/uL   Monocytes Relative 9 %    Monocytes Absolute 0.7 0.1 - 1.0 K/uL   Eosinophils Relative 3 %   Eosinophils Absolute 0.2 0.0 - 0.5 K/uL   Basophils Relative 1 %   Basophils Absolute 0.1 0.0 - 0.1 K/uL   Immature Granulocytes 0 %   Abs Immature Granulocytes 0.02 0.00 - 0.07 K/uL  Urinalysis, Routine w reflex microscopic -Urine, Clean Catch  Result Value Ref Range   Color, Urine STRAW (A) YELLOW   APPearance CLEAR CLEAR   Specific Gravity, Urine 1.009 1.005 - 1.030   pH 7.0 5.0 - 8.0   Glucose, UA 50 (A) NEGATIVE mg/dL   Hgb urine dipstick NEGATIVE NEGATIVE   Bilirubin Urine NEGATIVE NEGATIVE   Ketones, ur 5 (A) NEGATIVE mg/dL   Protein, ur NEGATIVE NEGATIVE mg/dL   Nitrite NEGATIVE NEGATIVE   Leukocytes,Ua NEGATIVE NEGATIVE  I-Stat beta hCG blood, ED  Result Value Ref Range   I-stat hCG, quantitative <5.0 <5 mIU/mL   Comment 3            Assessment/Plan: Karlee is a 22 y.o. female with type 1 diabetes on MDI and Dexcom CGM. Has a pattern of hyperglycemia overnight and after lunch, will adjust insulin doses. Hemoglobin A1c is 9.1% today which is higher then ADA goal of <7%. Advised that she should follow up with OBGYN to address the "lump" on lower abdomen since she declined examination today.     1. DM w/o complication type I, uncontrolled (HCC)/hypergylcemia/ - Reviewed CGM download. Discussed trends and patterns.  - Rotate injection sites to prevent scar tissue.  - bolus 15 minutes prior to eating to limit blood sugar spikes.  - Reviewed carb counting and importance of accurate carb counting.  - Discussed signs and symptoms of hypoglycemia. Always have glucose available.  - POCT glucose and hemoglobin A1c  - Reviewed growth chart.     2. Insulin dose change  - I  ncrease Lantus to 49 units. After 3 days, if still waking up over 150, increase 1-2 more units.  - Start Novolog 1 unit for every 5 grams of carbs.   3. Hypertension.  Follow with nephrology as needed.     Follow-up:   3 month.  Mychart message with blood sugars as needed for adjustments.   LOS: >40  spent today reviewing the medical chart, counseling the patient/family, and documenting today's visit.     Gretchen Short,  FNP-C  Pediatric Specialist  [address]  [place], [address]  Tele: [phone]

## 2023-06-04 MED ORDER — NOVOLOG FLEXPEN 100 UNIT/ML ~~LOC~~ SOPN: PEN_INJECTOR | SUBCUTANEOUS | 0 refills | Status: DC

## 2023-07-02 ENCOUNTER — Other Ambulatory Visit: Payer: Self-pay | Admitting: Family

## 2023-07-30 ENCOUNTER — Encounter: Payer: Self-pay | Admitting: *Deleted

## 2023-08-11 ENCOUNTER — Ambulatory Visit (INDEPENDENT_AMBULATORY_CARE_PROVIDER_SITE_OTHER): Payer: 59 | Admitting: Family

## 2023-08-11 ENCOUNTER — Encounter (INDEPENDENT_AMBULATORY_CARE_PROVIDER_SITE_OTHER): Payer: Self-pay | Admitting: Family

## 2023-08-11 VITALS — BP 122/86 | HR 84 | Ht 64.0 in | Wt 239.0 lb

## 2023-08-11 DIAGNOSIS — Z794 Long term (current) use of insulin: Secondary | ICD-10-CM

## 2023-08-11 DIAGNOSIS — E1065 Type 1 diabetes mellitus with hyperglycemia: Secondary | ICD-10-CM

## 2023-08-11 DIAGNOSIS — I10 Essential (primary) hypertension: Secondary | ICD-10-CM

## 2023-08-11 LAB — POCT GLYCOSYLATED HEMOGLOBIN (HGB A1C): Hemoglobin A1C: 10.3 % — AB (ref 4.0–5.6)

## 2023-08-11 LAB — POCT GLUCOSE (DEVICE FOR HOME USE): POC Glucose: 190 mg/dl — AB (ref 70–99)

## 2023-08-11 NOTE — Patient Instructions (Addendum)
-   Increase Lantus to 60 units   - If blood sugars are frequently <70 or <150 while fasting, contact me.   - Start new Novolog plan. Copies given   - Hemoglobin A1c is 10.3%   It was a pleasure seeing you in clinic today. Please do not hesitate to contact me if you have questions or concerns.   Please sign up for MyChart. This is a communication tool that allows you to send an email directly to me. This can be used for questions, prescriptions and blood sugar reports. We will also release labs to you with instructions on MyChart. Please do not use MyChart if you need immediate or emergency assistance. Ask our wonderful front office staff if you need assistance.

## 2023-08-11 NOTE — Progress Notes (Signed)
DIABETES PLAN  Rapid Acting Insulin (Novolog/FiASP (Aspart) and Humalog/Lyumjev (Lispro))  **Given for Food/Carbohydrates and High Sugar/Glucose**   DAYTIME (breakfast, lunch, dinner)  Target Blood Glucose 125 mg/dL Insulin Sensitivity Factor 25 Insulin to Carb Ratio 1 unit for 4 grams   Correction DOSE Food DOSE  (Glucose -Target)/Insulin Sensitivity Factor  Glucose (mg/dL) Units of Rapid Acting Insulin  Less than 125 0  126-150 1  151-175 2  175-200 3  201-225 4  226-250 5  251-275 6  276-300 7  301-325 8  326-350 9  351-375 10  376-400 11  401-425 12  426-450 13  451-475 14  476-500 15  501-525 16  526-550 17  551-575 18  576 or more 19    Number of carbohydrates divided by carb ratio  Number of Carbs Units of Rapid Acting Insulin  0-3 0  4-7 1  8-11 2  12-15 3  16-19 4  20-23 5  24-27 6  28-31 7  32-35 8  36-39 9  40-43 10  44-47 11  48-51 12  52-55 13  56-59 14  60-63 15  64-67 16  68-71 17  72-75 18  76-79 19  80-83 20  84-87 21  88-91 22  92-95 23  96-99 24  100-103 25  104-107 26  108-111 27  112-115 28  116-119 29  120-123 30  124-127 31  128-131 32  132-135 33  136-139 34  140-143 35  144-147 36  148-151 37  152-155 38  156-159 39  160-163 40  164+ (# carbs divided by 4)                  **Correction Dose + Food Dose = Number of units of rapid acting insulin **  Correction for High Sugar/Glucose Food/Carbohydrate  Measure Blood Glucose BEFORE you eat. (Fingerstick with Glucose Meter or check the reading on your Continuous Glucose Meter).  Use the table above or calculate the dose using the formula.  Add this dose to the Food/Carbohydrate dose if eating a meal.  Correction should not be given sooner than every 3 hours since the last dose of rapid acting insulin. 1. Count the number of carbohydrates you will be eating.  2. Use the table above or calculate the dose using the formula.  3. Add this dose to the  Correction dose if glucose is above target.         BEDTIME Target Blood Glucose 200 mg/dL Insulin Sensitivity Factor 30  Insulin to Carb Ratio  1 unit for 5 grams   Wait at least 3 hours after taking dinner dose of insulin BEFORE checking bedtime glucose.   Blood Sugar Less Than  100 mg/dL? Blood Sugar Between 101 - 199mg /dL? Blood Sugar Greater Than 200mg /dL?  You MUST EAT 15 carbs  1. Carb snack not needed  Carb snack not needed    2. Additional, Optional Carb Snack?  If you want more carbs, you CAN eat them now! Make sure to subtract MUST EAT carbs from total carbs then look at chart below to determine food dose. 2. Optional Carb Snack?   You CAN eat this! Make sure to add up total carbs then look at chart below to determine food dose. 2. Optional Carb Snack?   You CAN eat this! Make sure to add up total carbs then look at chart below to determine food dose.  3. Correction Dose of Insulin?  NO  3. Correction Dose of Insulin?  NO 3. Correction Dose of Insulin?  YES; please look at correction dose chart to determine correction dose.   Glucose (mg/dL) Units of Rapid Acting Insulin  Less than 200 0  201-230 1  231-260 2  261-290 3  291-320 4  321-350 5  351-380 6  381-410 7  411-440 8  441-470 9  471-500 10  501-530 11  531-560 12  561-590 13  591 or more 14         Number of Carbs Units of Rapid Acting Insulin  0-4 0  5-9 1  10-14 2  15-19 3  20-24 4  25-29 5  30-34 6  35-39 7  40-44 8  45-49 9  50-54 10  55-59 11  60-64 12  65-69 13  70-74 14  75-79 15  80-84 16  85-89 17  90-94 18  95-99 19  100-104 20  105-109 21  110-114 22  115-119 23  120-124 24  125-129 25  130-134 26  135-139 27  140-144 28  145-149 29  150-154 30  155-159 31  160+ (# carbs divided by 5)           Long Acting Insulin (Glargine (Basaglar/Lantus/Semglee)/Levemir/Tresiba)  **Remember long acting insulin must be given EVERY DAY, and NEVER skip this  dose**                                    Give 60  units at bedtime    If you have any questions/concerns PLEASE call [phone] to speak to the on-call  Pediatric Endocrinology provider at [facility].  Gretchen Short, NP

## 2023-08-11 NOTE — Progress Notes (Signed)
Pediatric Endocrinology Diabetes Consultation Follow-up Visit  Arly Blee [birth year] [id number]  Chief Complaint: Follow-up type 1 diabetes   Pcp, No   HPI: Marijayne  is a 22 y.o. female presenting for follow-up of type 1 diabetes. she is accompanied to this visit by her mother and father.  1. Indianna noted about two years ago that her urine was thick like juice. She saw her PCP and had elevated urine glucose. She went to [facility] where her HbA1c was 11%. She was diagnosed with T2DM, started on metformin, 500 mg, twice daily, and followed at [facility] until about 9 months ago. During that time her weight had continued to increase to apeak weight of 223 pounds in march 2017. Her HbA1c had decreased to about 6% as of her last visit.  Her weight has decreased from 217 pounds in May 2017 to 189 now. She has been trying to lose weight.                          About two weeks ago Kylene became aware that she was urinating more, had nocturia, and was drinking more. She also developed nausea, upset stomach severe enough to cause her not to want to eat, and abdominal pains. Her vision has been normal. She has lost 31 pounds since May. She presented to clinic with a A1c of >14 and urine ketones. She was admitted to the [facility] and started on two component method using Lantus and Novolog.   She had elevated insulin antibody with normal GAD and negative pancreatic islet cell antibody.   2. Since discharge from hospital on 12/2022, [name] has been doing well. No Hospitalizations.   School is going well, she is in her senior year and then plans to take a year off before starting work or going to [employer]. She is living in the dorms at school. Staying active by joining the run club at school and doing spin classes. Diet has been "ok" but fluctuates. She is taking Lexapro 20 mg once daily (follows with OBGYN). Has hydroxyzine for anxiety attacks but rarely taking.    Reports that diabetes care has been "better". She is carb counting more accurately and has not missed any injections. Novolo dose is around 10-12 units per meal. She does not feel like her blood sugars come down after meals. Hypoglycemia is rare, she is able to feel when she is low.    Concerns:  - Feels like she needs to increase her Basaglar dose and carb ratio.   Insulin regimen: 56 units of Basaglar. Novolog 120/30/6 plan  Hypoglycemia: Able to feel low blood sugars.  No glucagon needed recently.  Blood glucose download:  Dexcom CGM:    Med-alert ID: Not currently wearing. Injection sites: Arms, legs and abdomen  Annual labs due: 07/2023--> ordered  Ophthalmology due: 08/2022. Discussed with family today. Has appointment set up     3. ROS: Greater than 10 systems reviewed with pertinent positives listed in HPI, otherwise neg. Review of Systems  Constitutional:. Sleeping well.  2 lbs weight gain.  HENT: Negative.   Eyes: Negative for blurred vision and pain.  Respiratory: Negative for cough and shortness of breath.   Cardiovascular: Negative for chest pain and palpitations.  Gastrointestinal: Negative for abdominal pain, constipation, diarrhea and heartburn.  Genitourinary: Negative for frequency and urgency.  Musculoskeletal: Negative for neck pain.  Skin: Negative for itching and rash.  Neurological: Negative for dizziness, tremors,  sensory change and weakness.  Endo/Heme/Allergies: Negative for polydipsia.  Psychiatric/Behavioral: . The patient is not nervous/anxious.      Past Medical History:   Past Medical History:  Diagnosis Date   Allergy    Asthma    Diabetes mellitus without complication (HCC)    type 1   Gallstones    Hypertension    Evaluated by [facility] in 2019, did not follow up or have an appt in July 2019 per Nephrology's recs   Impacted ear wax 12/21/2015   Irregular menses    Nocturnal enuresis 07/01/2013   Obesity    Unspecified constipation  07/07/2013   Weight gain 07/07/2013    Medications:  Outpatient Encounter Medications as of 08/11/2023  Medication Sig Note   Accu-Chek FastClix Lancets MISC USE 1 LANCET TO CHECK GLUCOSE SIX TIMES DAILY    ACCU-CHEK GUIDE test strip USE TO CHECK GLUCOSE 6 TIMES DAILY    escitalopram (LEXAPRO) 20 MG tablet Take 1 tablet (20 mg total) by mouth daily.    hydrOXYzine (ATARAX) 10 MG tablet Take 1 tablet (10 mg total) by mouth 3 (three) times daily as needed.    Insulin Glargine (BASAGLAR KWIKPEN) 100 UNIT/ML Inject up to 50 units per day    NOVOLOG FLEXPEN 100 UNIT/ML FlexPen INJECT UP TO 50 UNITS SUBCUTANEOUSLY ONCE DAILY    albuterol (PROVENTIL HFA;VENTOLIN HFA) 108 (90 Base) MCG/ACT inhaler 2 puffs before exercise or every 4 to 6 hours as needed for cough or wheezing (Patient not taking: Reported on 08/01/2022) 11/03/2022: Needs refill   Continuous Blood Gluc Sensor (DEXCOM G7 SENSOR) MISC CHANGE SENSOR EVERY 10 DAYS (Patient not taking: Reported on 01/15/2023)    Glucagon (BAQSIMI ONE PACK) 3 MG/DOSE POWD PLACE 1 DOSE INTO THE NOSE AS NEEDED (Patient not taking: Reported on 08/11/2023)    Insulin Pen Needle (BD PEN NEEDLE NANO 2ND GEN) 32G X 4 MM MISC USE 1 PEN NEEDLE  SIX TIMES DAILY TO  INJECT  INSULIN    Norethindrone-Ethinyl Estradiol-Fe Biphas (LO LOESTRIN FE) 1 MG-10 MCG / 10 MCG tablet Take 1 tablet by mouth daily. Take 1 daily by mouth (Patient not taking: Reported on 01/15/2023)    No facility-administered encounter medications on file as of 08/11/2023.    Allergies: Allergies  Allergen Reactions   Blue Dyes (Parenteral) Swelling    Surgical History: Past Surgical History:  Procedure Laterality Date   CHOLECYSTECTOMY N/A 04/03/2021   Procedure: LAPAROSCOPIC CHOLECYSTECTOMY;  Surgeon: Lucretia Roers, MD;  Location: [facility];  Service: General;  Laterality: N/A;   TONSILLECTOMY     WISDOM TOOTH EXTRACTION      Family History:  Family History  Problem Relation Age of Onset    Diabetes Mother    Hypertension Mother    Diabetes Father    Hypertension Father    Hypertension Maternal Grandmother    Stroke Maternal Grandmother    Diabetes Maternal Grandmother    Thyroid disease Maternal Grandmother    Heart attack Maternal Grandfather    Diabetes Paternal Grandmother    Hypertension Paternal Grandmother    Dementia Paternal Grandmother    Kidney failure Paternal Grandfather    Hypertension Paternal Grandfather    Diabetes Paternal Grandfather    Thyroid disease Maternal Aunt       Social History: Lives with: Dorm at college.  Senior at [employer]   Physical Exam:  Vitals:   08/11/23 1319  BP: 122/86  Pulse: 84  SpO2: 97%  Weight: 239 lb (  108.4 kg)  Height: 5\' 4"  (1.626 m)        BP 122/86   Pulse 84   Ht 5\' 4"  (1.626 m)   Wt 239 lb (108.4 kg)   SpO2 97%   BMI 41.02 kg/m  Body mass index: body mass index is 41.02 kg/m. Growth %ile SmartLinks can only be used for patients less than 81 years old.  Ht Readings from Last 3 Encounters:  08/11/23 5\' 4"  (1.626 m)  01/30/23 5\' 4"  (1.626 m)  11/03/22 5\' 4"  (1.626 m)   Wt Readings from Last 3 Encounters:  08/11/23 239 lb (108.4 kg)  01/30/23 237 lb (107.5 kg)  01/15/23 237 lb 12.8 oz (107.9 kg)    Physical Exam   General: Well developed, well nourished female in no acute distress.   Head: Normocephalic, atraumatic.   Eyes:  Pupils equal and round. EOMI.   Sclera white.  No eye drainage.   Ears/Nose/Mouth/Throat: Nares patent, no nasal drainage.  Normal dentition, mucous membranes moist.   Neck: supple, no cervical lymphadenopathy, no thyromegaly Cardiovascular: regular rate, normal S1/S2, no murmurs Respiratory: No increased work of breathing.  Lungs clear to auscultation bilaterally.  No wheezes. Abdomen: soft, nontender, nondistended. No appreciable masses  Extremities: warm, well perfused, cap refill < 2 sec.   Musculoskeletal: Normal muscle mass.  Normal strength Skin: warm, dry.  No  rash or lesions. Neurologic: alert and oriented, normal speech, no tremor   Labs:  Last Hemoglobin A1c: 7.0% on 10/2021  Results for orders placed or performed in visit on 08/11/23  POCT Glucose (Device for Home Use)  Result Value Ref Range   Glucose Fasting, POC     POC Glucose 190 (A) 70 - 99 mg/dl  POCT glycosylated hemoglobin (Hb A1C)  Result Value Ref Range   Hemoglobin A1C 10.3 (A) 4.0 - 5.6 %   HbA1c POC (<> result, manual entry)     HbA1c, POC (prediabetic range)     HbA1c, POC (controlled diabetic range)      Assessment/Plan: Edithe is a 22 y.o. female with type 1 diabetes on MDI and Dexcom CGM. She is having frequent hyperglycemia throughout the day. After reviewing Dexcom and extensive conversation, she needs adjustments to insulin doses. Her hemoglobin A1c is 10.3% which is higher then ADA goal of <7%.    1. DM w/o complication type I, uncontrolled (HCC)/hypergylcemia/ - Reviewed  CGM download. Discussed trends and patterns.  - Rotate pump sites to prevent scar tissue.  - bolus 15 minutes prior to eating to limit blood sugar spikes.  - Reviewed carb counting and importance of accurate carb counting.  - Discussed signs and symptoms of hypoglycemia. Always have glucose available.  - POCT glucose and hemoglobin A1c  - Reviewed growth chart.  - Lab Orders         COMPLETE METABOLIC PANEL WITH GFR         Lipid panel         Microalbumin / creatinine urine ratio         T4, free         TSH         POCT Glucose (Device for Home Use)         POCT glycosylated hemoglobin (Hb A1C)      2. Insulin dose change  - Increase Basaglar to 60 units  - Start Novolog: ICR 1:4. ISF 1:25 >125 (day) and 200 at night.  - Copies of plan given and discussed.  3. Hypertension.  Follow with nephrology as needed.     Follow-up:   3 month. Mychart message with blood sugars as needed for adjustments.   LOS: >40  spent today reviewing the medical chart, counseling the  patient/family, and documenting today's visit.    Gretchen Short, DNP, FNP-C  Pediatric Specialist  [address]  [place], [address]  Tele: [phone]

## 2023-08-12 ENCOUNTER — Other Ambulatory Visit: Payer: Self-pay | Admitting: Family

## 2023-09-24 ENCOUNTER — Ambulatory Visit: Payer: 59 | Admitting: Nurse Practitioner

## 2023-10-02 ENCOUNTER — Other Ambulatory Visit (INDEPENDENT_AMBULATORY_CARE_PROVIDER_SITE_OTHER): Payer: Self-pay | Admitting: Family

## 2023-10-02 DIAGNOSIS — E1065 Type 1 diabetes mellitus with hyperglycemia: Secondary | ICD-10-CM

## 2023-10-12 ENCOUNTER — Other Ambulatory Visit (INDEPENDENT_AMBULATORY_CARE_PROVIDER_SITE_OTHER): Payer: Self-pay | Admitting: Family

## 2023-10-13 ENCOUNTER — Other Ambulatory Visit (INDEPENDENT_AMBULATORY_CARE_PROVIDER_SITE_OTHER): Payer: Self-pay | Admitting: Family

## 2023-10-13 DIAGNOSIS — E1065 Type 1 diabetes mellitus with hyperglycemia: Secondary | ICD-10-CM

## 2023-10-30 ENCOUNTER — Telehealth (INDEPENDENT_AMBULATORY_CARE_PROVIDER_SITE_OTHER): Payer: Self-pay | Admitting: Family

## 2023-10-30 ENCOUNTER — Telehealth (INDEPENDENT_AMBULATORY_CARE_PROVIDER_SITE_OTHER): Payer: Self-pay

## 2023-10-30 NOTE — Telephone Encounter (Signed)
Selected wrong patient to document.

## 2023-10-30 NOTE — Telephone Encounter (Signed)
  Name of who is calling: Icie Boullion  Caller's Relationship to Patient: Self  Best contact number: [phone]  Provider they see: Gretchen Short   Reason for call: Pt has an appt on Monday 12/16 but is moving from her dorm, she is wanting to know if she can make her appt virtual?      PRESCRIPTION REFILL ONLY  Name of prescription:  Pharmacy:

## 2023-11-02 ENCOUNTER — Encounter (INDEPENDENT_AMBULATORY_CARE_PROVIDER_SITE_OTHER): Payer: Self-pay | Admitting: Family

## 2023-11-02 ENCOUNTER — Telehealth (INDEPENDENT_AMBULATORY_CARE_PROVIDER_SITE_OTHER): Payer: 59 | Admitting: Family

## 2023-11-02 VITALS — Ht 64.0 in | Wt 239.0 lb

## 2023-11-02 DIAGNOSIS — Z794 Long term (current) use of insulin: Secondary | ICD-10-CM

## 2023-11-02 DIAGNOSIS — E1065 Type 1 diabetes mellitus with hyperglycemia: Secondary | ICD-10-CM

## 2023-11-02 DIAGNOSIS — I10 Essential (primary) hypertension: Secondary | ICD-10-CM

## 2023-11-02 DIAGNOSIS — L83 Acanthosis nigricans: Secondary | ICD-10-CM

## 2023-11-02 NOTE — Patient Instructions (Signed)
It was a pleasure seeing you in clinic today. Please do not hesitate to contact me if you have questions or concerns.   Please sign up for MyChart. This is a communication tool that allows you to send an email directly to me. This can be used for questions, prescriptions and blood sugar reports. We will also release labs to you with instructions on MyChart. Please do not use MyChart if you need immediate or emergency assistance. Ask our wonderful front office staff if you need assistance.   - Increase Basaglar to 63 units.

## 2023-11-02 NOTE — Progress Notes (Signed)
Is the patient/family in a moving vehicle? No If yes, please ask family to pull over and park in a safe place to continue the visit.  This is a Pediatric Specialist E-Visit consult/follow up provided via My Chart Video Visit (Caregility). [name]   consented to an E-Visit consult today.  Is the patient present for the video visit? Yes Location of patient: [name] is at Mother's home in [place](location) Is the patient located in the state of [place]? Yes Location of provider: Gretchen Short, FNP is at pediatric Specialist office (location) Patient was referred by No ref. provider found   The following participants were involved in this E-Visit: Shalynn CMA, Gretchen Short FNP, Jolleen Dalfonso (list of participants and their roles)  This visit was done via VIDEO   Chief Complain/ Reason for E-Visit today: Type 1 diabetes follow up  Total time on call: >20 spent today reviewing the medical chart, counseling the patient/family, and documenting today's visit.   Follow up: 3 months.   Pediatric Endocrinology Diabetes Consultation Follow-up Visit  Krista Wang 08-10-2001 [id number]  Chief Complaint: Follow-up type 1 diabetes   Pcp, No   HPI: [name]  is a 22 y.o. female presenting for follow-up of type 1 diabetes. she is accompanied to this visit by her mother and father.  1. Ruhi noted about two years ago that her urine was thick like juice. She saw her PCP and had elevated urine glucose. She went to [facility] where her HbA1c was 11%. She was diagnosed with T2DM, started on metformin, 500 mg, twice daily, and followed at [facility] until about 9 months ago. During that time her weight had continued to increase to apeak weight of 223 pounds in march 2017. Her HbA1c had decreased to about 6% as of her last visit.  Her weight has decreased from 217 pounds in May 2017 to 189 now. She has been trying to lose weight.                          About two  weeks ago Krista Wang became aware that she was urinating more, had nocturia, and was drinking more. She also developed nausea, upset stomach severe enough to cause her not to want to eat, and abdominal pains. Her vision has been normal. She has lost 31 pounds since May. She presented to clinic with a A1c of >14 and urine ketones. She was admitted to the [facility] and started on two component method using Lantus and Novolog.   She had elevated insulin antibody with normal GAD and negative pancreatic islet cell antibody.   2. Since discharge from hospital on 07/2023, Krista Wang has been doing well. No Hospitalizations.   She made the [employer] List in her senior year of college. She has started painting which has helped significantly with stress and anxiety. She has not been exercising lately but plans to restart soon.   Taking Lexapro 20 mg daily but is considering switching medications. Uses Hydroxyzine for anxiety, uses intermittently.   Using Dexcom CGM which is working well. She is making improvements with diabetes care. States that she forgets to take Lantus about once per week. She rarely forgets to take Novolog. She estimates taking 10-14 units per meal. She does not always add in her blood sugar when giving Novolog at meals. Hypoglycemia has been rare, none severe or requiring glucagon.    Insulin regimen: 60 units of Basaglar. Novolog 125/25/4plan  Hypoglycemia: Able  to feel low blood sugars.  No glucagon needed recently.  Blood glucose download:  Dexcom CGM:    Med-alert ID: Not currently wearing. Injection sites: Arms, legs and abdomen  Annual labs due: 07/2023--> ordered  Ophthalmology due: 08/2023. Discussed with family today. Has appointment set up     3. ROS: Greater than 10 systems reviewed with pertinent positives listed in HPI, otherwise neg. Review of Systems  Constitutional:. Sleeping well.   HENT: Negative.   Eyes: Negative for blurred vision and pain.   Respiratory: Negative for cough and shortness of breath.   Cardiovascular: Negative for chest pain and palpitations.  Gastrointestinal: Negative for abdominal pain, constipation, diarrhea and heartburn.  Genitourinary: Negative for frequency and urgency.  Musculoskeletal: Negative for neck pain.  Skin: Negative for itching and rash.  Neurological: Negative for dizziness, tremors, sensory change and weakness.  Endo/Heme/Allergies: Negative for polydipsia.  Psychiatric/Behavioral: . The patient is not nervous/anxious.      Past Medical History:   Past Medical History:  Diagnosis Date   Allergy    Asthma    Diabetes mellitus without complication (HCC)    type 1   Gallstones    Hypertension    Evaluated by [facility] in 2019, did not follow up or have an appt in July 2019 per Nephrology's recs   Impacted ear wax 12/21/2015   Irregular menses    Nocturnal enuresis 07/01/2013   Obesity    Unspecified constipation 07/07/2013   Weight gain 07/07/2013    Medications:  Outpatient Encounter Medications as of 11/02/2023  Medication Sig Note   Continuous Glucose Sensor (DEXCOM G7 SENSOR) MISC CHANGE SENSOR EVERY 10 DAYS    escitalopram (LEXAPRO) 20 MG tablet Take 1 tablet (20 mg total) by mouth daily.    hydrOXYzine (ATARAX) 10 MG tablet Take 1 tablet (10 mg total) by mouth 3 (three) times daily as needed.    Insulin Glargine (BASAGLAR KWIKPEN) 100 UNIT/ML INJECT UP TO 50 UNITS SUBCUTANEOUSLY DAILY    Insulin Pen Needle (BD PEN NEEDLE NANO 2ND GEN) 32G X 4 MM MISC USE 1 PEN NEEDLE  SIX TIMES DAILY TO  INJECT  INSULIN    NOVOLOG FLEXPEN 100 UNIT/ML FlexPen INJECT UP TO 50 UNITS SUBCUTANEOUSLY ONCE DAILY    Accu-Chek FastClix Lancets MISC USE 1 LANCET TO CHECK GLUCOSE SIX TIMES DAILY (Patient not taking: Reported on 11/02/2023)    ACCU-CHEK GUIDE test strip USE TO CHECK GLUCOSE 6 TIMES DAILY (Patient not taking: Reported on 11/02/2023)    acetone, urine, test (KETOSTIX) strip Check ketones  per protocol (Patient not taking: Reported on 11/02/2023)    albuterol (PROVENTIL HFA;VENTOLIN HFA) 108 (90 Base) MCG/ACT inhaler 2 puffs before exercise or every 4 to 6 hours as needed for cough or wheezing (Patient not taking: Reported on 08/01/2022) 11/03/2022: Needs refill   Glucagon (BAQSIMI ONE PACK) 3 MG/DOSE POWD PLACE 1 DOSE INTO THE NOSE AS NEEDED (Patient not taking: Reported on 11/02/2023)    Norethindrone-Ethinyl Estradiol-Fe Biphas (LO LOESTRIN FE) 1 MG-10 MCG / 10 MCG tablet Take 1 tablet by mouth daily. Take 1 daily by mouth (Patient not taking: Reported on 11/02/2023)    No facility-administered encounter medications on file as of 11/02/2023.    Allergies: Allergies  Allergen Reactions   Blue Dyes (Parenteral) Swelling    Surgical History: Past Surgical History:  Procedure Laterality Date   CHOLECYSTECTOMY N/A 04/03/2021   Procedure: LAPAROSCOPIC CHOLECYSTECTOMY;  Surgeon: Lucretia Roers, MD;  Location: [facility];  Service:  General;  Laterality: N/A;   TONSILLECTOMY     WISDOM TOOTH EXTRACTION      Family History:  Family History  Problem Relation Age of Onset   Diabetes Mother    Hypertension Mother    Diabetes Father    Hypertension Father    Hypertension Maternal Grandmother    Stroke Maternal Grandmother    Diabetes Maternal Grandmother    Thyroid disease Maternal Grandmother    Heart attack Maternal Grandfather    Diabetes Paternal Grandmother    Hypertension Paternal Grandmother    Dementia Paternal Grandmother    Kidney failure Paternal Grandfather    Hypertension Paternal Grandfather    Diabetes Paternal Grandfather    Thyroid disease Maternal Aunt       Social History: Lives with: Dorm at college.  Senior at [employer]   Physical Exam:  Vitals:   11/02/23 1447  Weight: 239 lb (108.4 kg)  Height: 5\' 4"  (1.626 m)    Ht 5\' 4"  (1.626 m) Comment: per Pt  Wt 239 lb (108.4 kg) Comment: Per PT  BMI 41.02 kg/m  Body mass index: body mass index is  41.02 kg/m. Growth %ile SmartLinks can only be used for patients less than 15 years old.  Ht Readings from Last 3 Encounters:  11/02/23 5\' 4"  (1.626 m)  08/11/23 5\' 4"  (1.626 m)  01/30/23 5\' 4"  (1.626 m)   Wt Readings from Last 3 Encounters:  11/02/23 239 lb (108.4 kg)  08/11/23 239 lb (108.4 kg)  01/30/23 237 lb (107.5 kg)    Physical Exam   General: Well developed, well nourished female in no acute distress.   Head: Normocephalic, atraumatic.   Eyes:  Pupils equal and round. EOMI.   Sclera white.  No eye drainage.     Cardiovascular: No cyanosis Respiratory: No increased work of breathing.   Skin:  No rash or lesions. Neurologic: alert and oriented, normal speech, no tremor   Labs:  Last Hemoglobin A1c: 10.3% on 07/2023  Results for orders placed or performed in visit on 08/11/23  POCT Glucose (Device for Home Use)   Collection Time: 08/11/23  1:30 PM  Result Value Ref Range   Glucose Fasting, POC     POC Glucose 190 (A) 70 - 99 mg/dl  POCT glycosylated hemoglobin (Hb A1C)   Collection Time: 08/11/23  1:34 PM  Result Value Ref Range   Hemoglobin A1C 10.3 (A) 4.0 - 5.6 %   HbA1c POC (<> result, manual entry)     HbA1c, POC (prediabetic range)     HbA1c, POC (controlled diabetic range)      Assessment/Plan: Nakirah is a 22 y.o. female with type 1 diabetes on MDI and Dexcom CGM. Pearl has a pattern of hyperglycemia between 12am-7am, she needs a strong Basaglar dose. Her average blood glucose has improved but remains higher then goal. Her time in target range is 33% (goal is >70%).    1. DM w/o complication type I, uncontrolled (HCC)/hypergylcemia/ - Reviewed  CGM download. Discussed trends and patterns.  - Rotate injection  sites to prevent scar tissue.  - bolus 15 minutes prior to eating to limit blood sugar spikes.  - Reviewed carb counting and importance of accurate carb counting.  - Discussed signs and symptoms of hypoglycemia. Always have glucose available.   - POCT glucose and hemoglobin A1c  - Reviewed growth chart.  - Encouraged daily activity and healthy diet to reduce insulin resistance.  - Discussed insulin pump therapy.  Lab Orders  No laboratory test(s) ordered today     2. Insulin dose change  - Increase Basaglar to 63 units.   3. Hypertension.  Follow with nephrology as needed.     Follow-up:   3 month. Mychart message with blood sugars as needed for adjustments.     Gretchen Short, DNP, FNP-C  Pediatric Specialist  [address]  [place], [address]  Tele: [phone]

## 2023-11-21 ENCOUNTER — Other Ambulatory Visit: Payer: Self-pay | Admitting: Adult Health

## 2023-11-23 ENCOUNTER — Other Ambulatory Visit: Payer: Self-pay | Admitting: Adult Health

## 2023-12-09 ENCOUNTER — Encounter (INDEPENDENT_AMBULATORY_CARE_PROVIDER_SITE_OTHER): Payer: Self-pay

## 2023-12-09 ENCOUNTER — Other Ambulatory Visit (INDEPENDENT_AMBULATORY_CARE_PROVIDER_SITE_OTHER): Payer: Self-pay

## 2023-12-09 MED ORDER — BASAGLAR KWIKPEN 100 UNIT/ML ~~LOC~~ SOPN: PEN_INJECTOR | SUBCUTANEOUS | 3 refills | Status: DC

## 2023-12-18 ENCOUNTER — Encounter: Payer: Self-pay | Admitting: Adult Health

## 2023-12-18 ENCOUNTER — Encounter: Payer: Self-pay | Admitting: *Deleted

## 2023-12-18 ENCOUNTER — Telehealth: Payer: 59 | Admitting: Adult Health

## 2023-12-18 VITALS — Ht 64.0 in

## 2023-12-18 DIAGNOSIS — F32A Depression, unspecified: Secondary | ICD-10-CM | POA: Diagnosis not present

## 2023-12-18 DIAGNOSIS — F419 Anxiety disorder, unspecified: Secondary | ICD-10-CM | POA: Diagnosis not present

## 2023-12-18 MED ORDER — ESCITALOPRAM OXALATE 20 MG PO TABS: 20.0000 mg | ORAL_TABLET | Freq: Every day | ORAL | 6 refills | Status: AC

## 2023-12-18 NOTE — Progress Notes (Signed)
  Subjective:     Patient ID: Krista Wang, female   DOB: [birth year], 23 y.o.   MRN: [id number] VIDEO VISIT.  HPI Krista Wang is a 23 year old black female,single, G0P0, being seen on video visit, requesting to get back on lexapro, was on 20 mg and stopped thought she did not need nay more ,but does, is anxious.    Review of Systems +anxious Vistaril makes her less hungry and stomach hurts  Reviewed past medical,surgical, social and family history. Reviewed medications and allergies.     Objective:   Physical Exam Ht 5\' 4"  (1.626 m)   LMP 10/06/2023   BMI 41.02 kg/m         12/18/2023    9:57 AM 11/03/2022    1:47 PM 06/18/2022    8:32 AM  Depression screen PHQ 2/9  Decreased Interest 2 1 1   Down, Depressed, Hopeless 2 3 1   PHQ - 2 Score 4 4 2   Altered sleeping 3 2 3   Tired, decreased energy 2 1 3   Change in appetite 2 3 3   Feeling bad or failure about yourself  0 1 1  Trouble concentrating 3 2 3   Moving slowly or fidgety/restless 0 0 1  Suicidal thoughts 0 0 0  PHQ-9 Score 14 13 16   Difficult doing work/chores  Somewhat difficult        12/18/2023    9:58 AM 11/03/2022    1:49 PM 06/18/2022    8:34 AM 05/26/2022    3:22 PM  GAD 7 : Generalized Anxiety Score  Nervous, Anxious, on Edge 3 3 3 3   Control/stop worrying 3 2 3 1   Worry too much - different things 3 2 3 1   Trouble relaxing 3 3 3 1   Restless 1 1 3 1   Easily annoyed or irritable 3 3 3 2   Afraid - awful might happen 2 2 3 1   Total GAD 7 Score 18 16 21 10   Anxiety Difficulty  Very difficult        Upstream - 12/18/23 0956       Pregnancy Intention Screening   Does the patient want to become pregnant in the next year? No    Does the patient's partner want to become pregnant in the next year? No    Would the patient like to discuss contraceptive options today? No      Contraception Wrap Up   Current Method Female Condom    End Method Female Condom             Assessment:     1. Anxiety and depression  (Primary) +anxious wants to get back on lexapro Will rx lexapro 20 mg but take 1/2 tablet for 1 week then increase to 1 tablet daily Meds ordered this encounter  Medications   escitalopram (LEXAPRO) 20 MG tablet    Sig: Take 1 tablet (20 mg total) by mouth daily.    Dispense:  30 tablet    Refill:  6    Supervising Provider:   Lazaro Arms [[id number]]      She says she does not need refill on Vistaril, try eating before taking Face time 6-10 minutes, I was in my office at [facility] and she was home  Plan:     Return in 6 weeks for pap and physical and ROS

## 2024-01-12 ENCOUNTER — Other Ambulatory Visit (INDEPENDENT_AMBULATORY_CARE_PROVIDER_SITE_OTHER): Payer: Self-pay | Admitting: Family

## 2024-01-12 DIAGNOSIS — E1065 Type 1 diabetes mellitus with hyperglycemia: Secondary | ICD-10-CM

## 2024-02-03 ENCOUNTER — Ambulatory Visit (INDEPENDENT_AMBULATORY_CARE_PROVIDER_SITE_OTHER): Payer: 59 | Admitting: Family

## 2024-02-23 ENCOUNTER — Encounter (INDEPENDENT_AMBULATORY_CARE_PROVIDER_SITE_OTHER): Payer: Self-pay

## 2024-03-02 ENCOUNTER — Ambulatory Visit (INDEPENDENT_AMBULATORY_CARE_PROVIDER_SITE_OTHER): Admitting: Family

## 2024-03-02 ENCOUNTER — Encounter (INDEPENDENT_AMBULATORY_CARE_PROVIDER_SITE_OTHER): Payer: Self-pay | Admitting: Family

## 2024-03-02 VITALS — BP 118/70 | HR 84 | Wt 216.4 lb

## 2024-03-02 DIAGNOSIS — L83 Acanthosis nigricans: Secondary | ICD-10-CM

## 2024-03-02 DIAGNOSIS — Z79899 Other long term (current) drug therapy: Secondary | ICD-10-CM | POA: Diagnosis not present

## 2024-03-02 DIAGNOSIS — E1065 Type 1 diabetes mellitus with hyperglycemia: Secondary | ICD-10-CM

## 2024-03-02 DIAGNOSIS — I10 Essential (primary) hypertension: Secondary | ICD-10-CM | POA: Diagnosis not present

## 2024-03-02 LAB — POCT GLUCOSE (DEVICE FOR HOME USE): POC Glucose: 99 mg/dL (ref 70–99)

## 2024-03-02 LAB — POCT GLYCOSYLATED HEMOGLOBIN (HGB A1C): HbA1c, POC (controlled diabetic range): 7.1 % — AB (ref 0.0–7.0)

## 2024-03-02 MED ORDER — NOVOLOG FLEXPEN 100 UNIT/ML ~~LOC~~ SOPN: PEN_INJECTOR | SUBCUTANEOUS | 5 refills | Status: AC

## 2024-03-02 MED ORDER — BASAGLAR KWIKPEN 100 UNIT/ML ~~LOC~~ SOPN: PEN_INJECTOR | SUBCUTANEOUS | 5 refills | Status: AC

## 2024-03-02 MED ORDER — DEXCOM G7 SENSOR MISC: 5 refills | Status: AC

## 2024-03-02 NOTE — Patient Instructions (Signed)
 It was a pleasure seeing you in clinic today. Please do not hesitate to contact me if you have questions or concerns.   Please sign up for MyChart. This is a communication tool that allows you to send an email directly to me. This can be used for questions, prescriptions and blood sugar reports. We will also release labs to you with instructions on MyChart. Please do not use MyChart if you need immediate or emergency assistance. Ask our wonderful front office staff if you need assistance.   - labs today  - hemoglobin A1c 7.1%.  - Referral to [facility] endocrinology

## 2024-03-02 NOTE — Progress Notes (Signed)
 Pediatric Endocrinology Diabetes Consultation Follow-up Visit  Krista Wang March 01, 2001 [id number]  Chief Complaint: Follow-up type 1 diabetes   Pcp, No   HPI: Krista Wang  is a 23 y.o. female presenting for follow-up of type 1 diabetes. she is accompanied to this visit by her mother and father.  1. [name] noted about two years ago that her urine was thick like juice. She saw her PCP and had elevated urine glucose. She went to [facility] where her HbA1c was 11%. She was diagnosed with T2DM, started on metformin, 500 mg, twice daily, and followed at [facility] until about 9 months ago. During that time her weight had continued to increase to apeak weight of 223 pounds in march 2017. Her HbA1c had decreased to about 6% as of her last visit.  Her weight has decreased from 217 pounds in May 2017 to 189 now. She has been trying to lose weight.                          About two weeks ago [name] became aware that she was urinating more, had nocturia, and was drinking more. She also developed nausea, upset stomach severe enough to cause her not to want to eat, and abdominal pains. Her vision has been normal. She has lost 31 pounds since May. She presented to clinic with a A1c of >14 and urine ketones. She was admitted to the [facility] and started on two component method using Lantus and Novolog.   She had elevated insulin antibody with normal GAD and negative pancreatic islet cell antibody.   2. Since discharge from hospital on 10/2023, [name] has been doing well. No Hospitalizations.   She will graduate from [employer] this semester and plans to get a masters in Mental health counseling. She has not been very active lately but does walk around campus. Diet has been health "for the most part"   Reports that diabetes care has improved and blood sugars are more stable. Dexcom G7 is working well. She takes Novolog injections 10 minutes before eating, rarely forgets to take  injection. Carb intake is 35-50 grams pre meal, she is feels confident with carb counting. No severe hypoglycemia. She feels symptoms of hypoglycemia when she is under 75.   Taking Lexapro 20 mg daily and hydroxyzine for anxiety. Managed by GYN.    Insulin regimen: 63 of Basaglar.  Novolog ICR: 1:4 - ISF: 25 - Target: 125  Hypoglycemia: Able to feel low blood sugars.  No glucagon needed recently.  Blood glucose download:  Dexcom CGM:    Med-alert ID: Not currently wearing. Injection sites: Arms, legs and abdomen  Annual labs due: 07/2023--> ordered  Ophthalmology due: 08/2023. Discussed with family today. Has appointment set up     3. ROS: Greater than 10 systems reviewed with pertinent positives listed in HPI, otherwise neg. Constitutional: Weight as above.  Sleeping well HEENT: No vision changes. No difficulty swallowing  Respiratory: No increased work of breathing currently GI: No constipation or diarrhea GU: No polyuria.  Musculoskeletal: No joint deformity Neuro: Normal affect. No tremors.  Endocrine: As above     Past Medical History:   Past Medical History:  Diagnosis Date   Allergy    Asthma    Diabetes mellitus without complication (HCC)    type 1   Gallstones    Hypertension    Evaluated by [facility] in 2019, did not follow up or have an appt in  July 2019 per Nephrology's recs   Impacted ear wax 12/21/2015   Irregular menses    Nocturnal enuresis 07/01/2013   Obesity    Unspecified constipation 07/07/2013   Weight gain 07/07/2013    Medications:  Outpatient Encounter Medications as of 03/02/2024  Medication Sig Note   Continuous Glucose Sensor (DEXCOM G7 SENSOR) MISC CHANGE SENSOR EVERY 10 DAYS    escitalopram (LEXAPRO) 20 MG tablet Take 1 tablet (20 mg total) by mouth daily.    Glucagon (BAQSIMI ONE PACK) 3 MG/DOSE POWD PLACE 1 DOSE INTO THE NOSE AS NEEDED    hydrOXYzine (ATARAX) 10 MG tablet Take 1 tablet (10 mg total) by mouth 3 (three) times daily  as needed.    Insulin Glargine (BASAGLAR KWIKPEN) 100 UNIT/ML INJECT UP TO 50 UNITS SUBCUTANEOUSLY DAILY (Patient taking differently: INJECT UP TO 64 UNITS SUBCUTANEOUSLY DAILY)    Insulin Pen Needle (BD PEN NEEDLE NANO 2ND GEN) 32G X 4 MM MISC USE 1 PEN NEEDLE  SIX TIMES DAILY TO  INJECT  INSULIN    NOVOLOG FLEXPEN 100 UNIT/ML FlexPen INJECT UP TO 50 UNITS SUBCUTANEOUSLY ONCE DAILY (Patient taking differently: INJECT UP TO 50 UNITS SUBCUTANEOUSLY ONCE DAILY ACCORDING TO SLIDING SCALE)    albuterol (PROVENTIL HFA;VENTOLIN HFA) 108 (90 Base) MCG/ACT inhaler 2 puffs before exercise or every 4 to 6 hours as needed for cough or wheezing (Patient not taking: Reported on 08/01/2022) 12/18/2023: Needs refill   No facility-administered encounter medications on file as of 03/02/2024.    Allergies: Allergies  Allergen Reactions   Blue Dyes (Parenteral) Swelling    Surgical History: Past Surgical History:  Procedure Laterality Date   CHOLECYSTECTOMY N/A 04/03/2021   Procedure: LAPAROSCOPIC CHOLECYSTECTOMY;  Surgeon: Awilda Bogus, MD;  Location: [facility];  Service: General;  Laterality: N/A;   TONSILLECTOMY     WISDOM TOOTH EXTRACTION      Family History:  Family History  Problem Relation Age of Onset   Diabetes Mother    Hypertension Mother    Diabetes Father    Hypertension Father    Hypertension Maternal Grandmother    Stroke Maternal Grandmother    Diabetes Maternal Grandmother    Thyroid disease Maternal Grandmother    Heart attack Maternal Grandfather    Diabetes Paternal Grandmother    Hypertension Paternal Grandmother    Dementia Paternal Grandmother    Kidney failure Paternal Grandfather    Hypertension Paternal Grandfather    Diabetes Paternal Grandfather    Thyroid disease Maternal Aunt       Social History: Lives with: Dorm at college.  Senior at [employer]   Physical Exam:  Vitals:   03/02/24 1057  BP: 118/70  Pulse: 84  Weight: 216 lb 6.4 oz (98.2 kg)     BP  118/70   Pulse 84   Wt 216 lb 6.4 oz (98.2 kg)   BMI 37.14 kg/m  Body mass index: body mass index is 37.14 kg/m. Growth %ile SmartLinks can only be used for patients less than 41 years old.  Ht Readings from Last 3 Encounters:  12/18/23 5\' 4"  (1.626 m)  11/02/23 5\' 4"  (1.626 m)  08/11/23 5\' 4"  (1.626 m)   Wt Readings from Last 3 Encounters:  03/02/24 216 lb 6.4 oz (98.2 kg)  11/02/23 239 lb (108.4 kg)  08/11/23 239 lb (108.4 kg)    Physical Exam   General: Well developed, well nourished female in no acute distress.   Head: Normocephalic, atraumatic.   Eyes:  Pupils  equal and round. EOMI.   Sclera white.  No eye drainage.   Ears/Nose/Mouth/Throat: Nares patent, no nasal drainage.  Normal dentition, mucous membranes moist.   Neck: supple, no cervical lymphadenopathy, no thyromegaly Cardiovascular: regular rate, normal S1/S2, no murmurs Respiratory: No increased work of breathing.  Lungs clear to auscultation bilaterally.  No wheezes. Abdomen: soft, nontender, nondistended. No appreciable masses  Extremities: warm, well perfused, cap refill < 2 sec.   Musculoskeletal: Normal muscle mass.  Normal strength Skin: warm, dry.  No rash or lesions. + acanthosis nigricans  Neurologic: alert and oriented, normal speech, no tremor   Labs:  Last Hemoglobin A1c: 10.3% on 07/2023  Results for orders placed or performed in visit on 03/02/24  POCT glycosylated hemoglobin (Hb A1C)   Collection Time: 03/02/24 11:10 AM  Result Value Ref Range   Hemoglobin A1C     HbA1c POC (<> result, manual entry)     HbA1c, POC (prediabetic range)     HbA1c, POC (controlled diabetic range) 7.1 (A) 0.0 - 7.0 %  POCT Glucose (Device for Home Use)   Collection Time: 03/02/24 11:14 AM  Result Value Ref Range   Glucose Fasting, POC     POC Glucose 99 70 - 99 mg/dl    Assessment/Plan: Nikka is a 23 y.o. female with type 1 diabetes on MDI and Dexcom CGM. Blood sugar levels have improved in conjunction  with improved compliance. Her hemoglobin A1c is 7.1%, higher then ADA goal of <7%. Time in target range meets goal at 70%. Disaya's diabetes picture appears to be a mix between type 1 diabetes (insulin requirement, 1 positive antibody) and type 2 diabetes (normal c peptide, acanthosis). She may benefit from adding Metformin as control continues to improve.     1. Type 1 diabetes with hyperglycemia  - Reviewed iCGM download. Discussed trends and patterns.  - Rotate injection  sites to prevent scar tissue.  - bolus 15 minutes prior to eating to limit blood sugar spikes.  - Reviewed carb counting and importance of accurate carb counting.  - Discussed signs and symptoms of hypoglycemia. Always have glucose available.  - POCT glucose and hemoglobin A1c  - Reviewed growth chart.  - Discussed possibility of starting Metformin. She will consider.  - Will check IA-2 and Znt8 antibodies  - Refer to adult endocrinology, discussed transition to adult care.  Lab Orders         Lipid panel         Microalbumin / creatinine urine ratio         T4, free         TSH         Comprehensive metabolic panel with GFR         IA-2 Antibody         ZNT8 Antibodies         POCT Glucose (Device for Home Use)         POCT glycosylated hemoglobin (Hb A1C)       2. Insulin dose change  - Continue current doses.   3. Hypertension.  Followed by Nephrology. Blood pressure normal today.     LOS: 45 minutes spent today reviewing the medical chart, counseling the patient/family, and documenting today's visit. This time does not include CGM interpretation.      Gretchen Short, DNP, FNP-C  Pediatric Specialist  [address]  [place], [address]  Tele: [phone]

## 2024-03-03 ENCOUNTER — Encounter (INDEPENDENT_AMBULATORY_CARE_PROVIDER_SITE_OTHER): Payer: Self-pay

## 2024-03-07 ENCOUNTER — Encounter (INDEPENDENT_AMBULATORY_CARE_PROVIDER_SITE_OTHER): Payer: Self-pay

## 2024-03-10 LAB — COMPREHENSIVE METABOLIC PANEL WITH GFR
AG Ratio: 1.3 (calc) (ref 1.0–2.5)
ALT: 12 U/L (ref 6–29)
AST: 18 U/L (ref 10–30)
Albumin: 4.6 g/dL (ref 3.6–5.1)
Alkaline phosphatase (APISO): 72 U/L (ref 31–125)
BUN/Creatinine Ratio: 8 (calc) (ref 6–22)
BUN: 5 mg/dL — ABNORMAL LOW (ref 7–25)
CO2: 26 mmol/L (ref 20–32)
Calcium: 9.4 mg/dL (ref 8.6–10.2)
Chloride: 100 mmol/L (ref 98–110)
Creat: 0.66 mg/dL (ref 0.50–0.96)
Globulin: 3.6 g/dL (ref 1.9–3.7)
Glucose, Bld: 96 mg/dL (ref 65–99)
Potassium: 3.9 mmol/L (ref 3.5–5.3)
Sodium: 138 mmol/L (ref 135–146)
Total Bilirubin: 0.4 mg/dL (ref 0.2–1.2)
Total Protein: 8.2 g/dL — ABNORMAL HIGH (ref 6.1–8.1)
eGFR: 127 mL/min/{1.73_m2} (ref >=60)

## 2024-03-10 LAB — LIPID PANEL
Cholesterol: 209 mg/dL — ABNORMAL HIGH (ref <200)
HDL: 42 mg/dL — ABNORMAL LOW (ref >=50)
LDL Cholesterol (Calc): 148 mg/dL — ABNORMAL HIGH
Non-HDL Cholesterol (Calc): 167 mg/dL — ABNORMAL HIGH (ref <130)
Total CHOL/HDL Ratio: 5 (calc) — ABNORMAL HIGH (ref <5.0)
Triglycerides: 83 mg/dL (ref <150)

## 2024-03-10 LAB — IA-2 ANTIBODY: IA-2 Antibody: <5.4 U/mL (ref <5.4)

## 2024-03-10 LAB — ZNT8 ANTIBODIES: ZNT8 Antibodies: <10 U/mL (ref <15)

## 2024-03-10 LAB — TSH: TSH: 1.13 m[IU]/L

## 2024-03-10 LAB — T4, FREE: Free T4: 1.5 ng/dL (ref 0.8–1.8)

## 2024-05-16 DIAGNOSIS — E119 Type 2 diabetes mellitus without complications: Secondary | ICD-10-CM | POA: Diagnosis not present

## 2024-08-05 ENCOUNTER — Encounter: Payer: Self-pay | Admitting: *Deleted

## 2024-08-18 ENCOUNTER — Encounter: Payer: Self-pay | Admitting: Advanced Practice Midwife

## 2024-08-18 ENCOUNTER — Other Ambulatory Visit (HOSPITAL_COMMUNITY)
Admission: RE | Admit: 2024-08-18 | Discharge: 2024-08-18 | Disposition: A | Discharge status: Home / Self Care | Source: Ambulatory Visit | Attending: Advanced Practice Midwife | Admitting: Advanced Practice Midwife

## 2024-08-18 ENCOUNTER — Ambulatory Visit: Admitting: Advanced Practice Midwife

## 2024-08-18 VITALS — BP 123/82 | HR 89 | Ht 65.0 in | Wt 233.0 lb

## 2024-08-18 DIAGNOSIS — E282 Polycystic ovarian syndrome: Secondary | ICD-10-CM | POA: Diagnosis not present

## 2024-08-18 DIAGNOSIS — J45909 Unspecified asthma, uncomplicated: Secondary | ICD-10-CM | POA: Diagnosis not present

## 2024-08-18 DIAGNOSIS — Z01419 Encounter for gynecological examination (general) (routine) without abnormal findings: Secondary | ICD-10-CM

## 2024-08-18 DIAGNOSIS — Z1331 Encounter for screening for depression: Secondary | ICD-10-CM

## 2024-08-18 DIAGNOSIS — R1032 Left lower quadrant pain: Secondary | ICD-10-CM | POA: Diagnosis not present

## 2024-08-18 DIAGNOSIS — Z1151 Encounter for screening for human papillomavirus (HPV): Secondary | ICD-10-CM | POA: Diagnosis not present

## 2024-08-18 MED ORDER — MEDROXYPROGESTERONE ACETATE 10 MG PO TABS
ORAL_TABLET | ORAL | 4 refills | Status: AC
Start: 2024-08-18 — End: ?

## 2024-08-18 NOTE — Progress Notes (Signed)
 Krista Wang 23 y.o.  Vitals:   08/18/24 0912  BP: 123/82  Pulse: 89     Filed Weights   08/18/24 0912  Weight: 233 lb (105.7 kg)    Past Medical History: Past Medical History:  Diagnosis Date   Allergy    Asthma    Diabetes mellitus without complication (HCC)    type 1   Gallstones    Hypertension    Evaluated by [facility] in 2019, did not follow up or have an appt in July 2019 per Nephrology's recs   Impacted ear wax 12/21/2015   Irregular menses    Nocturnal enuresis 07/01/2013   Obesity    Unspecified constipation 07/07/2013   Weight gain 07/07/2013    Past Surgical History: Past Surgical History:  Procedure Laterality Date   CHOLECYSTECTOMY N/A 04/03/2021   Procedure: LAPAROSCOPIC CHOLECYSTECTOMY;  Surgeon: Kallie Manuelita BROCKS, MD;  Location: [facility];  Service: General;  Laterality: N/A;   TONSILLECTOMY     WISDOM TOOTH EXTRACTION      Family History: Family History  Problem Relation Age of Onset   Diabetes Mother    Hypertension Mother    Diabetes Father    Hypertension Father    Hypertension Maternal Grandmother    Stroke Maternal Grandmother    Diabetes Maternal Grandmother    Thyroid  disease Maternal Grandmother    Heart attack Maternal Grandfather    Diabetes Paternal Grandmother    Hypertension Paternal Grandmother    Dementia Paternal Grandmother    Kidney failure Paternal Grandfather    Hypertension Paternal Grandfather    Diabetes Paternal Grandfather    Thyroid  disease Maternal Aunt     Social History: Social History   Tobacco Use   Smoking status: Never    Passive exposure: Never   Smokeless tobacco: Never  Vaping Use   Vaping status: Never Used  Substance Use Topics   Alcohol use: No   Drug use: No    Allergies:  Allergies  Allergen Reactions   Blue Dyes (Parenteral) Swelling      Current Outpatient Medications:    Continuous Glucose Sensor (DEXCOM G7 SENSOR) MISC, CHANGE SENSOR EVERY 10 DAYS, Disp: 3 each, Rfl:  5   escitalopram  (LEXAPRO ) 20 MG tablet, Take 1 tablet (20 mg total) by mouth daily., Disp: 30 tablet, Rfl: 6   Glucagon  (BAQSIMI  ONE PACK) 3 MG/DOSE POWD, PLACE 1 DOSE INTO THE NOSE AS NEEDED, Disp: 2 each, Rfl: 5   hydrOXYzine  (ATARAX ) 10 MG tablet, Take 1 tablet (10 mg total) by mouth 3 (three) times daily as needed., Disp: 30 tablet, Rfl: 0   Insulin  Glargine (BASAGLAR  KWIKPEN) 100 UNIT/ML, INJECT UP TO 64 UNITS SUBCUTANEOUSLY DAILY, Disp: 21 mL, Rfl: 5   Insulin  Pen Needle (BD PEN NEEDLE NANO 2ND GEN) 32G X 4 MM MISC, USE 1 PEN NEEDLE  SIX TIMES DAILY TO  INJECT  INSULIN , Disp: 200 each, Rfl: 11   medroxyPROGESTERone  (PROVERA ) 10 MG tablet, If more than 4 months w/o a period, take 1 PO q hs X 10, Disp: 10 tablet, Rfl: 4   NOVOLOG  FLEXPEN 100 UNIT/ML FlexPen, INJECT UP TO 50 UNITS SUBCUTANEOUSLY ONCE DAILY, Disp: 15 mL, Rfl: 5   albuterol  (PROVENTIL  HFA;VENTOLIN  HFA) 108 (90 Base) MCG/ACT inhaler, 2 puffs before exercise or every 4 to 6 hours as needed for cough or wheezing (Patient not taking: Reported on 08/18/2024), Disp: 2 Inhaler, Rfl: 1      08/18/2024    9:25 AM 12/18/2023  9:57 AM 11/03/2022    1:47 PM 06/18/2022    8:32 AM 05/26/2022    3:21 PM  Depression screen PHQ 2/9  Decreased Interest 0 2 1 1 1   Down, Depressed, Hopeless 0 2 3 1 2   PHQ - 2 Score 0 4 4 2 3   Altered sleeping 3 3 2 3 2   Tired, decreased energy 2 2 1 3 2   Change in appetite 2 2 3 3 1   Feeling bad or failure about yourself  1 0 1 1 1   Trouble concentrating 3 3 2 3  0  Moving slowly or fidgety/restless 0 0 0 1 0  Suicidal thoughts 0 0 0 0 0  PHQ-9 Score 11 14 13 16 9   Difficult doing work/chores   Somewhat difficult          08/18/2024    9:26 AM 12/18/2023    9:58 AM 11/03/2022    1:49 PM 06/18/2022    8:34 AM  GAD 7 : Generalized Anxiety Score  Nervous, Anxious, on Edge 2 3 3 3   Control/stop worrying 2 3 2 3   Worry too much - different things 2 3 2 3   Trouble relaxing 2 3 3 3   Restless 1 1 1 3    Easily annoyed or irritable 3 3 3 3   Afraid - awful might happen 1 2 2 3   Total GAD 7 Score 13 18 16 21   Anxiety Difficulty   Very difficult       Mental Health score normal No Follow up referred to [facility] Yes   Upstream - 08/18/24 9073       Pregnancy Intention Screening   Does the patient want to become pregnant in the next year? No    Does the patient's partner want to become pregnant in the next year? No    Would the patient like to discuss contraceptive options today? Yes      Contraception Wrap Up   Current Method Female Condom    End Method Female Condom    Contraception Counseling Provided Yes          The pregnancy intention screening data noted above was reviewed. Potential methods of contraception were discussed. The patient elected to proceed with Female Condom.   History of Present Illness: Here for pap/physical.  This is her first pap.  Dx w/Type 1/type 2 DM last year, sees endocrinologist who manages.  Uses condoms for [facility].  Has taken COCs (sprintec and LoLo) in the past, made her periods monthly and quite heavy.  Discussed Plan B prn.  Discussed HPV vaccine, info given.   Saw [facility] Jan 2025 for anxiety/depression, wanted to restart Lexapro .  Doing well on it, but still has some anxiety, vistaril  just makes her sleepy. Periods have gone from ~ q 6 months to about q 3-4 months now. Mom and sister have PCOS.  Feels like she has a bubble in lower Left side some times.  Had a real painful epidsode the other day, now better (? Ruptured cyst).  .   Prior cytology:   NONE   Review of Systems   Patient denies any headaches, blurred vision, shortness of breath, chest pain, abdominal pain, problems with bowel movements, urination, or intercourse.   Physical Exam: General:  Well developed, well nourished, no acute distress Skin:  Warm and dry Neck:  Midline trachea Lungs; Clear to auscultation bilaterally Breast:  No dominant palpable mass, retraction, or nipple  discharge Cardiovascular: Regular rate and rhythm Abdomen:  Soft, non  tender, no hepatosplenomegaly Pelvic:  External genitalia is normal in appearance.  The vagina is normal in appearance.  The cervix is nulliparous Extremities:  No swelling or varicosities noted  Chaperone: Peggy Dones  Impression: Normal GYN exam  PCOS--if goes more than 4 months without a period, take provera  (Rx sent) Pelvic US  to eval LLQ pain, suspect cysts c/w PCOS [facility] info given Continue Lexapro 

## 2024-08-18 NOTE — Patient Instructions (Addendum)
 [facility] offers online women's holistic mental health counseling and therapy provided by licensed mental health counselors and therapists.   You can refer yourself using the link below: (if it isn't clickable from your mychart account, copy and paste it in a new browser).  If you have ANY problems, please let me know and I will help troubleshoot.   [email or web address]  OR  [email or web address]

## 2024-08-19 LAB — CYTOLOGY - PAP
Chlamydia: NEGATIVE
Comment: NEGATIVE
Comment: NEGATIVE
Comment: NORMAL
Diagnosis: NEGATIVE
High risk HPV: NEGATIVE
Neisseria Gonorrhea: NEGATIVE

## 2024-08-31 ENCOUNTER — Telehealth: Payer: Self-pay

## 2024-08-31 NOTE — Telephone Encounter (Signed)
 Left voice message for patient to call to get ultrasound scheduled.

## 2024-09-15 ENCOUNTER — Other Ambulatory Visit: Payer: Self-pay | Admitting: Adult Health

## 2024-09-29 ENCOUNTER — Other Ambulatory Visit: Payer: Self-pay

## 2024-09-29 ENCOUNTER — Emergency Department (HOSPITAL_COMMUNITY)
Admission: EM | Admit: 2024-09-29 | Discharge: 2024-09-29 | Disposition: A | Discharge status: Home / Self Care | Attending: Emergency Medicine | Admitting: Emergency Medicine

## 2024-09-29 DIAGNOSIS — R112 Nausea with vomiting, unspecified: Secondary | ICD-10-CM | POA: Diagnosis not present

## 2024-09-29 DIAGNOSIS — E1065 Type 1 diabetes mellitus with hyperglycemia: Secondary | ICD-10-CM | POA: Diagnosis not present

## 2024-09-29 DIAGNOSIS — Z794 Long term (current) use of insulin: Secondary | ICD-10-CM | POA: Insufficient documentation

## 2024-09-29 LAB — URINALYSIS, ROUTINE W REFLEX MICROSCOPIC
Bilirubin Urine: NEGATIVE
Glucose, UA: NEGATIVE mg/dL
Hgb urine dipstick: NEGATIVE
Ketones, ur: NEGATIVE mg/dL
Leukocytes,Ua: NEGATIVE
Nitrite: NEGATIVE
Protein, ur: NEGATIVE mg/dL
Specific Gravity, Urine: 1.008 (ref 1.005–1.030)
pH: 6 (ref 5.0–8.0)

## 2024-09-29 LAB — COMPREHENSIVE METABOLIC PANEL WITH GFR
ALT: 21 U/L (ref 0–44)
AST: 28 U/L (ref 15–41)
Albumin: 4.7 g/dL (ref 3.5–5.0)
Alkaline Phosphatase: 65 U/L (ref 38–126)
Anion gap: 12 (ref 5–15)
BUN: <5 mg/dL — ABNORMAL LOW (ref 6–20)
CO2: 27 mmol/L (ref 22–32)
Calcium: 9.6 mg/dL (ref 8.9–10.3)
Chloride: 100 mmol/L (ref 98–111)
Creatinine, Ser: 0.6 mg/dL (ref 0.44–1.00)
GFR, Estimated: >60 mL/min (ref >60)
Glucose, Bld: 129 mg/dL — ABNORMAL HIGH (ref 70–99)
Potassium: 3.6 mmol/L (ref 3.5–5.1)
Sodium: 139 mmol/L (ref 135–145)
Total Bilirubin: 0.5 mg/dL (ref 0.0–1.2)
Total Protein: 8.1 g/dL (ref 6.5–8.1)

## 2024-09-29 LAB — CBC
HCT: 43.9 % (ref 36.0–46.0)
Hemoglobin: 14.3 g/dL (ref 12.0–15.0)
MCH: 28.1 pg (ref 26.0–34.0)
MCHC: 32.6 g/dL (ref 30.0–36.0)
MCV: 86.2 fL (ref 80.0–100.0)
Platelets: 323 K/uL (ref 150–400)
RBC: 5.09 MIL/uL (ref 3.87–5.11)
RDW: 12.7 % (ref 11.5–15.5)
WBC: 6.1 K/uL (ref 4.0–10.5)
nRBC: 0 % (ref 0.0–0.2)

## 2024-09-29 LAB — CBG MONITORING, ED: Glucose-Capillary: 139 mg/dL — ABNORMAL HIGH (ref 70–99)

## 2024-09-29 LAB — PREGNANCY, URINE: Preg Test, Ur: NEGATIVE

## 2024-09-29 LAB — LIPASE, BLOOD: Lipase: 16 U/L (ref 11–51)

## 2024-09-29 LAB — BETA-HYDROXYBUTYRIC ACID: Beta-Hydroxybutyric Acid: 0.18 mmol/L (ref 0.05–0.27)

## 2024-09-29 MED ORDER — ONDANSETRON 4 MG PO TBDP
4.0000 mg | ORAL_TABLET | Freq: Once | ORAL | Status: AC
  Administered 2024-09-29: 4 mg via ORAL
  Filled 2024-09-29: qty 1

## 2024-09-29 NOTE — Discharge Instructions (Addendum)
 Thank you for visiting the Emergency Department today. It was a pleasure to be part of your healthcare team.  Your test results showed no acute findings.  Your beta-hydroxybutyrate acid is still pending.  As discussed, at home, rest, hydrate, and resume normal diet as tolerated. It is important to watch for warning signs such as worsening pain, fever, or uncontrollable nausea, vomiting, diarrhea.  If any of these happen, return to the Emergency Department or call 911. Thank you for trusting us  with your health.

## 2024-09-29 NOTE — ED Triage Notes (Signed)
 Pt states yesterday was vomiting and started to have headache, increased thirst and increased urine output. Thinks she may be in DKA. Denies any vomiting today.

## 2024-09-29 NOTE — ED Provider Notes (Signed)
 [name] Provider Note   CSN: [id number] Arrival date & time: 09/29/24  1456     Patient presents with: Emesis and Urinary Frequency   Krista Wang is a 23 y.o. female with a notable history of T1DM, who presents to the ED with nausea x 2 weeks and vomiting and diarrhea that began 4 days ago.  Patient states that over the last week she has had increased urinary frequency, where she is getting up/down all night long to use the bathroom. Patient also endorses occasional headaches. Patient states that she has been fully complaint with her insulin  regimen.  Patient denies fevers, abdominal pain, or other urinary symptoms, such as pain or hematuria. Patient denies hematochezia or hematemesis. Patient states that the last time she was seen for the same symptoms she was in DKA.     Emesis Urinary Frequency       Prior to Admission medications   Medication Sig Start Date End Date Taking? Authorizing Provider  albuterol  (PROVENTIL  HFA;VENTOLIN  HFA) 108 (90 Base) MCG/ACT inhaler 2 puffs before exercise or every 4 to 6 hours as needed for cough or wheezing Patient not taking: Reported on 08/18/2024 08/18/17   Theotis Allena HERO, MD  Continuous Glucose Sensor (DEXCOM G7 SENSOR) MISC CHANGE SENSOR EVERY 10 DAYS 03/02/24   Verdon Darnel, NP  escitalopram  (LEXAPRO ) 20 MG tablet Take 1 tablet (20 mg total) by mouth daily. 12/18/23   Signa Delon LABOR, NP  Glucagon  (BAQSIMI  ONE PACK) 3 MG/DOSE POWD PLACE 1 DOSE INTO THE NOSE AS NEEDED 10/29/21   Verdon Darnel, NP  hydrOXYzine  (ATARAX ) 10 MG tablet Take 1 tablet by mouth three times daily as needed 09/15/24   Signa Delon LABOR, NP  Insulin  Glargine (BASAGLAR  KWIKPEN) 100 UNIT/ML INJECT UP TO 64 UNITS SUBCUTANEOUSLY DAILY 03/02/24   Verdon Darnel, NP  Insulin  Pen Needle (BD PEN NEEDLE NANO 2ND GEN) 32G X 4 MM MISC USE 1 PEN NEEDLE  SIX TIMES DAILY TO  INJECT  INSULIN  01/05/23   Verdon Darnel,  NP  medroxyPROGESTERone  (PROVERA ) 10 MG tablet If more than 4 months w/o a period, take 1 PO q hs X 10 08/18/24   Cresenzo-Dishmon, Cathlean, CNM  NOVOLOG  FLEXPEN 100 UNIT/ML FlexPen INJECT UP TO 50 UNITS SUBCUTANEOUSLY ONCE DAILY 03/02/24   Verdon Darnel, NP    Allergies: Blue dyes (parenteral)    Review of Systems  Gastrointestinal:  Positive for vomiting.  Genitourinary:  Positive for frequency.    Updated Vital Signs BP 127/85   Pulse 81   Temp 98.6 F (37 C) (Oral)   Resp 18   Ht 5' 4 (1.626 m)   Wt 98.4 kg   LMP 08/18/2024 (Exact Date)   SpO2 97%   BMI 37.25 kg/m   Physical Exam Vitals and nursing note reviewed.  Constitutional:      General: She is not in acute distress.    Appearance: Normal appearance.  HENT:     Head: Normocephalic and atraumatic.  Eyes:     Extraocular Movements: Extraocular movements intact.     Conjunctiva/sclera: Conjunctivae normal.     Pupils: Pupils are equal, round, and reactive to light.  Cardiovascular:     Rate and Rhythm: Normal rate and regular rhythm.     Pulses: Normal pulses.  Pulmonary:     Effort: Pulmonary effort is normal. No respiratory distress.  Abdominal:     General: Abdomen is flat.     Palpations: Abdomen is soft.  Tenderness: There is no abdominal tenderness.  Musculoskeletal:        General: Normal range of motion.     Cervical back: Normal range of motion.  Skin:    General: Skin is warm and dry.     Capillary Refill: Capillary refill takes less than 2 seconds.  Neurological:     General: No focal deficit present.     Mental Status: She is alert. Mental status is at baseline.  Psychiatric:        Mood and Affect: Mood normal.     (all labs ordered are listed, but only abnormal results are displayed) Labs Reviewed  COMPREHENSIVE METABOLIC PANEL WITH GFR - Abnormal; Notable for the following components:      Result Value   Glucose, Bld 129 (*)    BUN <5 (*)    All other components within normal  limits  CBG MONITORING, ED - Abnormal; Notable for the following components:   Glucose-Capillary 139 (*)    All other components within normal limits  LIPASE, BLOOD  CBC  URINALYSIS, ROUTINE W REFLEX MICROSCOPIC  BETA-HYDROXYBUTYRIC ACID  PREGNANCY, URINE    EKG: None  Radiology: No results found.   Procedures   Medications Ordered in the ED  ondansetron  (ZOFRAN -ODT) disintegrating tablet 4 mg (4 mg Oral Given 09/29/24 1811)    Clinical Course as of 10/14/24 0843  Thu Sep 29, 2024  1758 Beta-hydroxybutyric acid [ML]  1813 POC urine preg, ED [ML]    Clinical Course User Index [ML] Willma Duwaine CROME, PA                                 Medical Decision Making Amount and/or Complexity of Data Reviewed Labs: ordered. Decision-making details documented in ED Course.  Risk Prescription drug management.   Patient presents to the ED for concern of multiple symptoms concerned for DKA, this involves an extensive number of treatment options, and is a complaint that carries with it a high risk of complications and morbidity.    The differential diagnosis includes: DKA Gastritis/colitis UTI Other infectious etiology   Co morbidities that complicate the patient evaluation: T1DM  Additional history obtained:  Additional history obtained from  [facility] and Outside Medical Records.  Patient's mother, who is a good historian, present for visit. External records from outside source obtained and reviewed including prior visits concerning patients T1DM.  Lab Tests: I ordered, and personally interpreted labs.   The pertinent results include:  CMP: mildly elevated glucose at 129  Medicines ordered and prescription drug management: I ordered medications: Ondansetron  4mg  ODT  for nausea   Reevaluation of the patient after these medicines showed that the patient improved I have reviewed the patients home medicines and have made adjustments as needed  Test Considered: Diagnostic  testing was considered based on the patient's presenting symptoms, risk factors, and initial clinical assessment.  CT imaging was considered as part of the diagnostic workup, however, due to patient's benign abdominal exam and overall reassuring clinical picture, advanced imaging was not indicated at this time. The approach to diagnostic testing prioritized exclusion of life-threatening conditions  Problem List / ED Course: Problem List: Nausea/vomiting  Increased urinary frequency  Emergency Department Course: The patient presented with multiple symptoms concerned for DKA. Initial assessment included history, physical exam, and review of prior medical records.  Physical exam was unremarkable, with no abdominal tenderness and no signs of dehydration or systemic  illness.  Laboratory evaluation obtained - glucose mildly elevated at 129 otherwise all labs are within normal limits. Patient was treated symptomatically with ondansetron  which was well-tolerated.  She passed a PO challenge without nausea or vomiting.  Given stable vitals, benign exam, reassuring labs, and improvement with antiemetics, patient is appropriate for outpatient management.  She was advised on supportive care measures at home and instructed to follow-up with her primary care provider for ongoing evaluation.  Return precautions reviewed  Reevaluation: After the interventions noted above, I reevaluated the patient and found that they have :improved  Dispostion: Discharge with supportive care measures and close follow-up from PCP for further evaluation and care.      Final diagnoses:  Nausea and vomiting, unspecified vomiting type    ED Discharge Orders     None          Willma Duwaine CROME, [place] 10/14/24 0844    Francesca Elsie CROME, MD 10/15/24 1341

## 2024-09-29 NOTE — ED Notes (Signed)
Pt tolerating PO food and fluids
